# Patient Record
Sex: Male | Born: 1985 | Race: White | Hispanic: No | Smoking: Never smoker
Health system: Southern US, Community
[De-identification: ages and names within clinical notes are randomized; demographics above are authoritative.]

## PROBLEM LIST (undated history)

## (undated) DIAGNOSIS — R12 Heartburn: Secondary | ICD-10-CM

## (undated) DIAGNOSIS — Q211 Atrial septal defect, unspecified: Secondary | ICD-10-CM

## (undated) DIAGNOSIS — F32A Depression, unspecified: Secondary | ICD-10-CM

## (undated) DIAGNOSIS — I519 Heart disease, unspecified: Secondary | ICD-10-CM

## (undated) DIAGNOSIS — I341 Nonrheumatic mitral (valve) prolapse: Secondary | ICD-10-CM

## (undated) DIAGNOSIS — F329 Major depressive disorder, single episode, unspecified: Secondary | ICD-10-CM

## (undated) DIAGNOSIS — F419 Anxiety disorder, unspecified: Secondary | ICD-10-CM

## (undated) HISTORY — PX: CARDIAC SURGERY: SHX584

## (undated) HISTORY — DX: Heartburn: R12

## (undated) HISTORY — DX: Heart disease, unspecified: I51.9

## (undated) HISTORY — PX: PATENT DUCTUS ARTERIOUS REPAIR: SHX269

## (undated) HISTORY — DX: Depression, unspecified: F32.A

## (undated) HISTORY — DX: Major depressive disorder, single episode, unspecified: F32.9

---
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
## 2007-04-29 ENCOUNTER — Emergency Department (HOSPITAL_COMMUNITY): Admission: EM | Admit: 2007-04-29 | Discharge: 2007-04-29 | Payer: Self-pay | Admitting: Emergency Medicine

## 2009-08-05 ENCOUNTER — Emergency Department: Payer: Self-pay | Admitting: Emergency Medicine

## 2010-03-07 ENCOUNTER — Encounter: Admission: RE | Admit: 2010-03-07 | Discharge: 2010-03-07 | Payer: Self-pay | Admitting: Internal Medicine

## 2010-08-04 ENCOUNTER — Emergency Department (HOSPITAL_COMMUNITY)
Admission: EM | Admit: 2010-08-04 | Discharge: 2010-08-04 | Disposition: A | Discharge status: Home / Self Care | Payer: Self-pay | Attending: Emergency Medicine | Admitting: Emergency Medicine

## 2010-08-04 DIAGNOSIS — T18108A Unspecified foreign body in esophagus causing other injury, initial encounter: Secondary | ICD-10-CM | POA: Insufficient documentation

## 2010-08-04 DIAGNOSIS — R110 Nausea: Secondary | ICD-10-CM | POA: Insufficient documentation

## 2010-08-04 DIAGNOSIS — IMO0002 Reserved for concepts with insufficient information to code with codable children: Secondary | ICD-10-CM | POA: Insufficient documentation

## 2010-10-14 ENCOUNTER — Emergency Department (HOSPITAL_COMMUNITY): Payer: BC Managed Care – PPO

## 2010-10-14 ENCOUNTER — Encounter (HOSPITAL_COMMUNITY): Payer: Self-pay | Admitting: Radiology

## 2010-10-14 ENCOUNTER — Emergency Department (HOSPITAL_COMMUNITY)
Admission: EM | Admit: 2010-10-14 | Discharge: 2010-10-14 | Disposition: A | Discharge status: Home / Self Care | Payer: BC Managed Care – PPO | Attending: Emergency Medicine | Admitting: Emergency Medicine

## 2010-10-14 DIAGNOSIS — S7000XA Contusion of unspecified hip, initial encounter: Secondary | ICD-10-CM | POA: Insufficient documentation

## 2010-10-14 DIAGNOSIS — M545 Low back pain, unspecified: Secondary | ICD-10-CM | POA: Insufficient documentation

## 2010-10-14 DIAGNOSIS — S139XXA Sprain of joints and ligaments of unspecified parts of neck, initial encounter: Secondary | ICD-10-CM | POA: Insufficient documentation

## 2010-10-14 DIAGNOSIS — M542 Cervicalgia: Secondary | ICD-10-CM | POA: Insufficient documentation

## 2010-10-14 LAB — CBC
HCT: 46.2 % (ref 39.0–52.0)
Hemoglobin: 17.1 g/dL — ABNORMAL HIGH (ref 13.0–17.0)
MCH: 32.2 pg (ref 26.0–34.0)
MCV: 87 fL (ref 78.0–100.0)
Platelets: 305 10*3/uL (ref 150–400)
RBC: 5.31 MIL/uL (ref 4.22–5.81)
WBC: 6 10*3/uL (ref 4.0–10.5)

## 2010-10-14 LAB — BASIC METABOLIC PANEL
BUN: 9 mg/dL (ref 6–23)
CO2: 25 mEq/L (ref 19–32)
Chloride: 107 mEq/L (ref 96–112)
GFR calc Af Amer: >60 mL/min (ref >60)
GFR calc non Af Amer: >60 mL/min (ref >60)

## 2010-10-14 LAB — ETHANOL: Alcohol, Ethyl (B): 266 mg/dL — ABNORMAL HIGH (ref 0–10)

## 2010-10-14 LAB — TYPE AND SCREEN: Antibody Screen: NEGATIVE

## 2010-10-14 MED ORDER — IOHEXOL 300 MG/ML  SOLN
80.0000 mL | Freq: Once | INTRAMUSCULAR | Status: AC | PRN
  Administered 2010-10-14: 80 mL via INTRAVENOUS

## 2011-12-08 ENCOUNTER — Emergency Department (HOSPITAL_COMMUNITY): Payer: BC Managed Care – PPO

## 2011-12-08 ENCOUNTER — Emergency Department (HOSPITAL_COMMUNITY)
Admission: EM | Admit: 2011-12-08 | Discharge: 2011-12-08 | Disposition: A | Discharge status: Home / Self Care | Payer: BC Managed Care – PPO | Attending: Emergency Medicine | Admitting: Emergency Medicine

## 2011-12-08 ENCOUNTER — Encounter (HOSPITAL_COMMUNITY): Payer: Self-pay | Admitting: *Deleted

## 2011-12-08 DIAGNOSIS — M25519 Pain in unspecified shoulder: Secondary | ICD-10-CM | POA: Insufficient documentation

## 2011-12-08 DIAGNOSIS — R0602 Shortness of breath: Secondary | ICD-10-CM | POA: Insufficient documentation

## 2011-12-08 DIAGNOSIS — T07XXXA Unspecified multiple injuries, initial encounter: Secondary | ICD-10-CM

## 2011-12-08 DIAGNOSIS — IMO0002 Reserved for concepts with insufficient information to code with codable children: Secondary | ICD-10-CM | POA: Insufficient documentation

## 2011-12-08 DIAGNOSIS — S42009A Fracture of unspecified part of unspecified clavicle, initial encounter for closed fracture: Secondary | ICD-10-CM | POA: Insufficient documentation

## 2011-12-08 DIAGNOSIS — R079 Chest pain, unspecified: Secondary | ICD-10-CM | POA: Insufficient documentation

## 2011-12-08 DIAGNOSIS — M25559 Pain in unspecified hip: Secondary | ICD-10-CM | POA: Insufficient documentation

## 2011-12-08 DIAGNOSIS — M25569 Pain in unspecified knee: Secondary | ICD-10-CM | POA: Insufficient documentation

## 2011-12-08 DIAGNOSIS — S42001A Fracture of unspecified part of right clavicle, initial encounter for closed fracture: Secondary | ICD-10-CM

## 2011-12-08 DIAGNOSIS — S3981XA Other specified injuries of abdomen, initial encounter: Secondary | ICD-10-CM | POA: Insufficient documentation

## 2011-12-08 HISTORY — DX: Anxiety disorder, unspecified: F41.9

## 2011-12-08 LAB — CBC
HCT: 41 % (ref 39.0–52.0)
MCH: 32.8 pg (ref 26.0–34.0)
MCV: 88.9 fL (ref 78.0–100.0)
Platelets: 255 10*3/uL (ref 150–400)
RBC: 4.61 MIL/uL (ref 4.22–5.81)
RDW: 12.3 % (ref 11.5–15.5)
WBC: 7.4 10*3/uL (ref 4.0–10.5)

## 2011-12-08 LAB — POCT I-STAT, CHEM 8
Calcium, Ion: 1.12 mmol/L (ref 1.12–1.32)
HCT: 44 % (ref 39.0–52.0)
Hemoglobin: 15 g/dL (ref 13.0–17.0)
Sodium: 146 mEq/L — ABNORMAL HIGH (ref 135–145)
TCO2: 21 mmol/L (ref 0–100)

## 2011-12-08 LAB — COMPREHENSIVE METABOLIC PANEL
AST: 24 U/L (ref 0–37)
Albumin: 4.4 g/dL (ref 3.5–5.2)
Alkaline Phosphatase: 82 U/L (ref 39–117)
Chloride: 106 mEq/L (ref 96–112)
Potassium: 4 mEq/L (ref 3.5–5.1)
Sodium: 143 mEq/L (ref 135–145)
Total Bilirubin: 0.2 mg/dL — ABNORMAL LOW (ref 0.3–1.2)
Total Protein: 6.9 g/dL (ref 6.0–8.3)

## 2011-12-08 LAB — URINALYSIS, MICROSCOPIC ONLY
Bilirubin Urine: NEGATIVE
Hgb urine dipstick: NEGATIVE
Ketones, ur: NEGATIVE mg/dL
Nitrite: NEGATIVE
Specific Gravity, Urine: 1.04 — ABNORMAL HIGH (ref 1.005–1.030)
pH: 5 (ref 5.0–8.0)

## 2011-12-08 LAB — SAMPLE TO BLOOD BANK

## 2011-12-08 LAB — LACTIC ACID, PLASMA: Lactic Acid, Venous: 2.6 mmol/L — ABNORMAL HIGH (ref 0.5–2.2)

## 2011-12-08 LAB — PROTIME-INR: Prothrombin Time: 13.3 seconds (ref 11.6–15.2)

## 2011-12-08 MED ORDER — MORPHINE SULFATE 4 MG/ML IJ SOLN
4.0000 mg | Freq: Once | INTRAMUSCULAR | Status: AC
  Administered 2011-12-08: 4 mg via INTRAVENOUS

## 2011-12-08 MED ORDER — OXYCODONE-ACETAMINOPHEN 5-325 MG PO TABS: 2.0000 | ORAL_TABLET | ORAL | Status: AC | PRN

## 2011-12-08 MED ORDER — MORPHINE SULFATE 10 MG/ML IJ SOLN
INTRAMUSCULAR | Status: AC
  Administered 2011-12-08: 6 mg via INTRAVENOUS
  Filled 2011-12-08: qty 1

## 2011-12-08 MED ORDER — IOHEXOL 300 MG/ML  SOLN
100.0000 mL | Freq: Once | INTRAMUSCULAR | Status: AC | PRN
  Administered 2011-12-08: 100 mL via INTRAVENOUS

## 2011-12-08 MED ORDER — IBUPROFEN 800 MG PO TABS: 800.0000 mg | ORAL_TABLET | Freq: Three times a day (TID) | ORAL | Status: AC

## 2011-12-08 MED ORDER — ONDANSETRON HCL 4 MG/2ML IJ SOLN
INTRAMUSCULAR | Status: AC
  Administered 2011-12-08: 4 mg via INTRAVENOUS
  Filled 2011-12-08: qty 2
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
## 2011-12-08 NOTE — Progress Notes (Signed)
Orthopedic Tech Progress Note Patient Details:  Steve Stanley 16-Feb-1986 [id number]  Ortho Devices Type of Ortho Device: Sling immobilizer Ortho Device/Splint Interventions: Application   Cammer, Mickie Bail 12/08/2011, 7:38 AM
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
## 2012-04-17 ENCOUNTER — Encounter (INDEPENDENT_AMBULATORY_CARE_PROVIDER_SITE_OTHER): Payer: Self-pay | Admitting: General Surgery

## 2012-04-17 ENCOUNTER — Ambulatory Visit (INDEPENDENT_AMBULATORY_CARE_PROVIDER_SITE_OTHER): Payer: BC Managed Care – PPO | Admitting: General Surgery

## 2012-04-17 VITALS — BP 115/79 | HR 86 | Temp 98.4°F | Resp 14 | Ht 63.0 in | Wt 133.0 lb

## 2012-04-17 DIAGNOSIS — K4090 Unilateral inguinal hernia, without obstruction or gangrene, not specified as recurrent: Secondary | ICD-10-CM

## 2012-04-17 NOTE — Progress Notes (Signed)
Patient ID: Steve Stanley, male   DOB: 1986-02-07, 26 y.o.   MRN: [id number]  Chief Complaint  Patient presents with  . Hernia    HPI Steve A Cocca is a 26 y.o. male Referred by Dr. Brunilda Payor for a right inguinal hernia. Patient has had this for possibly 10-15 years. He states at the right lower hernia has become more painful over the last several months. He also notes that the hernia is become increased in size. There does continue to be reducible at this time but is interfering with his job duties. Patient has no signs or symptoms of constipation or incarceration at this time. HPI  Past Medical History  Diagnosis Date  . Anxiety   . Depression   . Heart disease, unspecified   . Heartburn     Past Surgical History  Procedure Date  . Patent ductus arterious repair     History reviewed. No pertinent family history.  Social History History  Substance Use Topics  . Smoking status: Current Every Day Smoker  . Smokeless tobacco: Not on file  . Alcohol Use: Yes    Allergies  Allergen Reactions  . Benadryl (Diphenhydramine Hcl)     Elevated heart rate    Current Outpatient Prescriptions  Medication Sig Dispense Refill  . clonazePAM (KLONOPIN) 0.5 MG tablet Take 0.5 mg by mouth 3 (three) times daily as needed. anxiety      . FLUoxetine (PROZAC) 10 MG tablet Take 20 mg by mouth daily.         Review of Systems Review of Systems  Blood pressure 115/79, pulse 86, temperature 98.4 F (36.9 C), temperature source Temporal, resp. rate 14, height 5\' 3"  (1.6 m), weight 133 lb (60.328 kg).  Physical Exam Physical Exam   Assessment    26 year old male with a reducible right inguinal hernia and moderate size    Plan    1. We'll proceed operative for a laparoscopic right inguinal hernia peritonitis.  2.All risks and benefits were discussed with the patient, to generally include infection, bleeding, damage to surrounding structures, and recurrence. Alternatives were offered and  described.  All questions were answered and the patient voiced understanding of the procedure and wishes to proceed at this point.        Marigene Ehlers., Saydee Zolman 04/17/2012, 4:15 PM

## 2012-04-22 ENCOUNTER — Telehealth (INDEPENDENT_AMBULATORY_CARE_PROVIDER_SITE_OTHER): Payer: Self-pay | Admitting: General Surgery

## 2012-04-22 NOTE — Telephone Encounter (Signed)
Pt called to ask if Dr. Derrell Lolling would write him out of work.  He works in a [employer] regularly; there is no light duty available.  He is experiencing a problem with bowel and bladder control with the straining to lift.  Suggested "adult diaper/ underwear" be used to prevent soiling of his clothes, but he does not want to use these.  Please advise.

## 2012-04-22 NOTE — Telephone Encounter (Signed)
He should be able to have bowel and bladder function and his hernia surgery shouldn't have changed that.  He can come see me in clinic next week or see Urge doc at anytime if he thinks its urgent.

## 2012-04-22 NOTE — Telephone Encounter (Signed)
Called pt and updated him with Dr. Jacinto Halim response to his request.  He will contact his PCP for the problem.

## 2012-05-01 DIAGNOSIS — K4090 Unilateral inguinal hernia, without obstruction or gangrene, not specified as recurrent: Secondary | ICD-10-CM

## 2012-05-01 HISTORY — PX: HERNIA REPAIR: SHX51

## 2012-05-06 ENCOUNTER — Other Ambulatory Visit (INDEPENDENT_AMBULATORY_CARE_PROVIDER_SITE_OTHER): Payer: Self-pay | Admitting: General Surgery

## 2012-05-06 ENCOUNTER — Telehealth (INDEPENDENT_AMBULATORY_CARE_PROVIDER_SITE_OTHER): Payer: Self-pay | Admitting: General Surgery

## 2012-05-06 DIAGNOSIS — Z8719 Personal history of other diseases of the digestive system: Secondary | ICD-10-CM

## 2012-05-06 MED ORDER — HYDROCODONE-ACETAMINOPHEN 5-325 MG PO TABS: 1.0000 | ORAL_TABLET | Freq: Four times a day (QID) | ORAL | Status: DC | PRN

## 2012-05-06 NOTE — Telephone Encounter (Signed)
Pt called in stating her is out of pain medication and that ibuprofen is not working well enough.  This is his first Rx refill since surgery.  I informed him that I can call him in ou standard authorization Rx for hydrocodone-acetaminophen 5-325 mg #30 no refills.  He was okay with this.

## 2012-05-19 ENCOUNTER — Encounter (INDEPENDENT_AMBULATORY_CARE_PROVIDER_SITE_OTHER): Payer: BC Managed Care – PPO | Admitting: General Surgery

## 2012-05-26 ENCOUNTER — Encounter (INDEPENDENT_AMBULATORY_CARE_PROVIDER_SITE_OTHER): Payer: Self-pay

## 2012-05-26 ENCOUNTER — Encounter (INDEPENDENT_AMBULATORY_CARE_PROVIDER_SITE_OTHER): Payer: Self-pay | Admitting: General Surgery

## 2012-05-26 ENCOUNTER — Ambulatory Visit (INDEPENDENT_AMBULATORY_CARE_PROVIDER_SITE_OTHER): Payer: BC Managed Care – PPO | Admitting: General Surgery

## 2012-05-26 VITALS — BP 110/82 | HR 60 | Temp 98.4°F | Resp 12 | Ht 64.0 in | Wt 128.0 lb

## 2012-05-26 DIAGNOSIS — Z9889 Other specified postprocedural states: Secondary | ICD-10-CM
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
## 2012-12-28 ENCOUNTER — Emergency Department (HOSPITAL_COMMUNITY)
Admission: EM | Admit: 2012-12-28 | Discharge: 2012-12-28 | Disposition: A | Discharge status: Home / Self Care | Payer: BC Managed Care – PPO | Source: Home / Self Care

## 2012-12-28 ENCOUNTER — Encounter (HOSPITAL_COMMUNITY): Payer: Self-pay | Admitting: Emergency Medicine

## 2012-12-28 DIAGNOSIS — S46819A Strain of other muscles, fascia and tendons at shoulder and upper arm level, unspecified arm, initial encounter: Secondary | ICD-10-CM

## 2012-12-28 DIAGNOSIS — S335XXA Sprain of ligaments of lumbar spine, initial encounter: Secondary | ICD-10-CM

## 2012-12-28 DIAGNOSIS — S39012A Strain of muscle, fascia and tendon of lower back, initial encounter: Secondary | ICD-10-CM

## 2012-12-28 MED ORDER — KETOROLAC TROMETHAMINE 60 MG/2ML IM SOLN
60.0000 mg | Freq: Once | INTRAMUSCULAR | Status: AC
  Administered 2012-12-28: 60 mg via INTRAMUSCULAR

## 2012-12-28 MED ORDER — KETOROLAC TROMETHAMINE 60 MG/2ML IM SOLN
INTRAMUSCULAR | Status: AC
  Filled 2012-12-28: qty 2

## 2012-12-28 MED ORDER — DICLOFENAC POTASSIUM 50 MG PO TABS: 50.0000 mg | ORAL_TABLET | Freq: Three times a day (TID) | ORAL | Status: DC

## 2012-12-28 MED ORDER — CARISOPRODOL 250 MG PO TABS: 250.0000 mg | ORAL_TABLET | Freq: Four times a day (QID) | ORAL | Status: DC

## 2012-12-28 MED ORDER — TRAMADOL HCL 50 MG PO TABS: 50.0000 mg | ORAL_TABLET | Freq: Four times a day (QID) | ORAL | Status: DC | PRN

## 2012-12-28 MED ORDER — DICLOFENAC SODIUM 1 % TD GEL: 1.0000 "application " | Freq: Four times a day (QID) | TRANSDERMAL | Status: DC

## 2012-12-28 NOTE — ED Provider Notes (Signed)
Medical screening examination/treatment/procedure(s) were performed by non-physician practitioner and as supervising physician I was immediately available for consultation/collaboration.  Sam Overbeck, M.D.  Leeloo Silverthorne C Aedon Deason, MD 12/28/12 1733 

## 2012-12-28 NOTE — ED Provider Notes (Signed)
History    CSN: [id number] Arrival date & time 12/28/12  1110  First MD Initiated Contact with Patient 12/28/12 1245     Chief Complaint  Patient presents with  . Back Pain   (Consider location/radiation/quality/duration/timing/severity/associated sxs/prior Treatment) HPI Comments: 27 year old male presents with pain in his bilateral trapezii that radiates inferiorly along the parathoracic musculature and spreads along the trapezii muscle. Also complaining of pain in the low back/para lumbar musculature. His job entails lifting heavy objects all day long. 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 He denies any particular event that caused pain. The pain is been progressing over the past week increasing as he works. Denies paresthesias however early in the morning he has substantial muscle soreness and weakness in the lower extremities. After approximately 30 minutes he regained his strength in his lower extremities.  Past Medical History  Diagnosis Date  . Anxiety   . Depression   . Heart disease, unspecified   . Heartburn    Past Surgical History  Procedure Laterality Date  . Patent ductus arterious repair    . Hernia repair  05/01/2012    rih repair   No family history on file. History  Substance Use Topics  . Smoking status: Current Every Day Smoker  . Smokeless tobacco: Not on file  . Alcohol Use: Yes    Review of Systems  Constitutional: Negative.   Respiratory: Negative.   Gastrointestinal: Negative.   Genitourinary: Negative.   Musculoskeletal:       As per HPI  Skin: Negative.   Neurological: Negative for dizziness, tremors, weakness, numbness and headaches.    Allergies  Benadryl  Home Medications   Current Outpatient Rx  Name  Route  Sig  Dispense  Refill  . carisoprodol (SOMA) 250 MG tablet   Oral   Take 1 tablet (250 mg total) by mouth 4 (four) times daily.   24 tablet   0   .  clonazePAM (KLONOPIN) 0.5 MG tablet   Oral   Take 0.5 mg by mouth 3 (three) times daily as needed. anxiety         . diclofenac (CATAFLAM) 50 MG tablet   Oral   Take 1 tablet (50 mg total) by mouth 3 (three) times daily. Take with food   21 tablet   0   . diclofenac sodium (VOLTAREN) 1 % GEL   Topical   Apply 1 application topically 4 (four) times daily.   100 g   0   . FLUoxetine (PROZAC) 10 MG tablet   Oral   Take 20 mg by mouth daily.          Marland Kitchen HYDROcodone-acetaminophen (NORCO) 5-325 MG per tablet   Oral   Take 1 tablet by mouth every 6 (six) hours as needed for pain.   30 tablet   0   . traMADol (ULTRAM) 50 MG tablet   Oral   Take 1 tablet (50 mg total) by mouth every 6 (six) hours as needed for pain.   18 tablet   0    BP 137/66  Pulse 80  Temp(Src) 98 F (36.7 C) (Oral)  Resp 18  SpO2 100% Physical Exam  Nursing note and vitals reviewed. Constitutional: He is oriented to person, place, and time. He appears well-developed and well-nourished.  HENT:  Head: Normocephalic and atraumatic.  Eyes: EOM are normal. Left eye exhibits no discharge.  Neck:  Normal range of motion. Neck supple.  Cardiovascular: Normal rate.   Pulmonary/Chest: Effort normal.  Musculoskeletal:  Tenderness in the bilateral trapezii, across the ridges and along the medial aspect trapezii and rhomboid muscles. Is also tenderness in the bilateral para lumbar musculature with the right greater than left. No spinal deformity or tenderness. Peripheral neuro exam is normal.  Neurological: He is alert and oriented to person, place, and time. No cranial nerve deficit. He exhibits normal muscle tone.  Skin: Skin is warm and dry.  Psychiatric: He has a normal mood and affect.    ED Course  Procedures (including critical care time) Labs Reviewed - No data to display No results found. 1. Trapezius strain, unspecified laterality, initial encounter   2. Lumbar strain, initial encounter      MDM  Off work for 3 days. Stretches as demonstrated. Apply diclofenac gel and apply heat pack over it. Cataflam 50 mg 3 times a day when necessary pain Tramadol 50 mg every 6 hours when necessary pain Diclofenac gel 4 times a day No heavy lifting bending or pulling. Muscular back muscles the rest.  Hayden Rasmussen, NP 12/28/12 1336

## 2012-12-28 NOTE — ED Notes (Signed)
C/o neck and back pain, onset about 4 days. Pt states he does heavy lifting at work, he noticed Saturday that his legs were tingling and going numb for 70min-1hour at a time.  Pt has taken Advil with little relief.  Pt states he has had some nausea from the pain.  Pt denies any trauma.  Tami Dora Sims Student

## 2014-11-23 ENCOUNTER — Encounter (HOSPITAL_COMMUNITY): Payer: Self-pay | Admitting: Emergency Medicine

## 2014-11-23 ENCOUNTER — Emergency Department (INDEPENDENT_AMBULATORY_CARE_PROVIDER_SITE_OTHER)
Admission: EM | Admit: 2014-11-23 | Discharge: 2014-11-23 | Disposition: A | Discharge status: Home / Self Care | Payer: Self-pay | Source: Home / Self Care | Attending: Family Medicine | Admitting: Family Medicine

## 2014-11-23 DIAGNOSIS — K602 Anal fissure, unspecified: Secondary | ICD-10-CM

## 2014-11-23 MED ORDER — DILTIAZEM GEL 2 %
CUTANEOUS | Status: DC
Start: 2014-11-23 — End: 2018-07-06

## 2014-11-23 NOTE — ED Notes (Signed)
C/o rectal bleeding which started today Denies any injuries No tx tried

## 2014-11-23 NOTE — Discharge Instructions (Signed)
Anal Fissure, Adult An anal fissure is a small tear or crack in the skin around the anus. Bleeding from a fissure usually stops on its own within a few minutes. However, bleeding will often reoccur with each bowel movement until the crack heals.  CAUSES   Passing large, hard stools.  Frequent diarrheal stools.  Constipation.  Inflammatory bowel disease (Crohn's disease or ulcerative colitis).  Infections.  Anal sex. SYMPTOMS   Small amounts of blood seen on your stools, on toilet paper, or in the toilet after a bowel movement.  Rectal bleeding.  Painful bowel movements.  Itching or irritation around the anus. DIAGNOSIS Your caregiver will examine the anal area. An anal fissure can usually be seen with careful inspection. A rectal exam may be performed and a short tube (anoscope) may be used to examine the anal canal. TREATMENT   You may be instructed to take fiber supplements. These supplements can soften your stool to help make bowel movements easier.  Sitz baths may be recommended to help heal the tear. Do not use soap in the sitz baths.  A medicated cream or ointment may be prescribed to lessen discomfort. HOME CARE INSTRUCTIONS   Maintain a diet high in fruits, whole grains, and vegetables. Avoid constipating foods like bananas and dairy products.  Take sitz baths as directed by your caregiver.  Drink enough fluids to keep your urine clear or pale yellow.  Only take over-the-counter or prescription medicines for pain, discomfort, or fever as directed by your caregiver. Do not take aspirin as this may increase bleeding.  Do not use ointments containing numbing medications (anesthetics) or hydrocortisone. They could slow healing. SEEK MEDICAL CARE IF:   Your fissure is not completely healed within 3 days.  You have further bleeding.  You have a fever.  You have diarrhea mixed with blood.  You have pain.  Your problem is getting worse rather than  better. MAKE SURE YOU:   Understand these instructions.  Will watch your condition.  Will get help right away if you are not doing well or get worse. Document Released: 06/10/2005 Document Revised: 09/02/2011 Document Reviewed: 11/25/2010 [facility] Patient Information 2015 [employer]. This information is not intended to replace advice given to you by your health care provider. Make sure you discuss any questions you have with your health care provider.   Please use Sitz baths (can get at the pharmacy) 2-3 x a week over the next few weeks. Increase your fiber intake as well to soften stools. The gel will need to be used up to 3x daily for 8 weeks, this will allow for healing. [facility]. Should this continue past 8 6-8 weeks or worsens, please f/u with your primary care.

## 2014-11-23 NOTE — ED Provider Notes (Signed)
CSN: [id number]     Arrival date & time 11/23/14  1910 History   First MD Initiated Contact with Patient 11/23/14 1929     Chief Complaint  Patient presents with  . Rectal Bleeding   (Consider location/radiation/quality/duration/timing/severity/associated sxs/prior Treatment) HPI Comments: Patient presents with rectal bleeding that began today after wiping following a "hard" bowel movement. He has a history of constipation. He has mild irritation but no pain. No abdominal pain. No fever or chills. No diarrhea or bloody diarrhea. No history of this in the past and no history of hemorrhoids.   Patient is a 29 y.o. male presenting with hematochezia. The history is provided by the patient.  Rectal Bleeding   Past Medical History  Diagnosis Date  . Anxiety   . Depression   . Heart disease, unspecified   . Heartburn    Past Surgical History  Procedure Laterality Date  . Patent ductus arterious repair    . Hernia repair  05/01/2012    rih repair   History reviewed. No pertinent family history. History  Substance Use Topics  . Smoking status: Current Every Day Smoker  . Smokeless tobacco: Not on file  . Alcohol Use: Yes    Review of Systems  Gastrointestinal: Positive for hematochezia.  All other systems reviewed and are negative.   Allergies  Benadryl  Home Medications   Prior to Admission medications   Medication Sig Start Date End Date Taking? Authorizing Provider  carisoprodol (SOMA) 250 MG tablet Take 1 tablet (250 mg total) by mouth 4 (four) times daily. 12/28/12   Hayden Rasmussen, NP  clonazePAM (KLONOPIN) 0.5 MG tablet Take 0.5 mg by mouth 3 (three) times daily as needed. anxiety    Historical Provider, MD  diclofenac (CATAFLAM) 50 MG tablet Take 1 tablet (50 mg total) by mouth 3 (three) times daily. Take with food 12/28/12   Hayden Rasmussen, NP  diclofenac sodium (VOLTAREN) 1 % GEL Apply 1 application topically 4 (four) times daily. 12/28/12   Hayden Rasmussen, NP  diltiazem 2 % GEL Apply  lightly to area tid x 8 weeks 11/23/14   Riki Sheer, PA-C  FLUoxetine (PROZAC) 10 MG tablet Take 20 mg by mouth daily.     Historical Provider, MD  HYDROcodone-acetaminophen (NORCO) 5-325 MG per tablet Take 1 tablet by mouth every 6 (six) hours as needed for pain. 05/06/12   Axel Filler, MD  traMADol (ULTRAM) 50 MG tablet Take 1 tablet (50 mg total) by mouth every 6 (six) hours as needed for pain. 12/28/12   Hayden Rasmussen, NP   BP 117/80 mmHg  Pulse 78  Temp(Src) 97.9 F (36.6 C) (Oral)  Resp 16  SpO2 98% Physical Exam  Constitutional: He is oriented to person, place, and time. He appears well-developed and well-nourished. No distress.  Genitourinary: Guaiac negative stool.  Small anal fissure (non-necrotic) in the lower portion of the rectal. Tender to palpation. Rectal exam normal. Stool without blood and guaiac normal. No evidence of hemorrhoid.   Neurological: He is alert and oriented to person, place, and time.  Skin: Skin is warm and dry. He is not diaphoretic.  Psychiatric: His behavior is normal.  Nursing note and vitals reviewed.   ED Course  Procedures (including critical care time) Labs Review Labs Reviewed - No data to display  Imaging Review No results found.   MDM   1. Anal fissure    No evidence we are dealing with Crohn's disease. Educated re: fiber intake. Use of sitz  baths for relief. Will start Diltiazem 2% gel (compounded) 3x daily for 8 weeks. F/U if worsening symptoms with PCP.     Riki SheerMichelle G Tunis Gentle, PA-C 11/23/14 2043

## 2015-10-02 ENCOUNTER — Emergency Department (HOSPITAL_COMMUNITY)
Admission: EM | Admit: 2015-10-02 | Discharge: 2015-10-02 | Disposition: A | Discharge status: Home / Self Care | Payer: Self-pay | Attending: Emergency Medicine | Admitting: Emergency Medicine

## 2015-10-02 ENCOUNTER — Ambulatory Visit (HOSPITAL_COMMUNITY)
Admission: EM | Admit: 2015-10-02 | Discharge: 2015-10-02 | Payer: Self-pay | Attending: Emergency Medicine | Admitting: Emergency Medicine

## 2015-10-02 ENCOUNTER — Emergency Department (HOSPITAL_COMMUNITY): Payer: Self-pay

## 2015-10-02 ENCOUNTER — Encounter (HOSPITAL_COMMUNITY): Payer: Self-pay | Admitting: Emergency Medicine

## 2015-10-02 ENCOUNTER — Encounter (HOSPITAL_COMMUNITY): Payer: Self-pay

## 2015-10-02 DIAGNOSIS — Z791 Long term (current) use of non-steroidal anti-inflammatories (NSAID): Secondary | ICD-10-CM | POA: Insufficient documentation

## 2015-10-02 DIAGNOSIS — R079 Chest pain, unspecified: Secondary | ICD-10-CM | POA: Insufficient documentation

## 2015-10-02 DIAGNOSIS — F419 Anxiety disorder, unspecified: Secondary | ICD-10-CM | POA: Insufficient documentation

## 2015-10-02 DIAGNOSIS — R112 Nausea with vomiting, unspecified: Secondary | ICD-10-CM

## 2015-10-02 DIAGNOSIS — Z79899 Other long term (current) drug therapy: Secondary | ICD-10-CM | POA: Insufficient documentation

## 2015-10-02 DIAGNOSIS — F17200 Nicotine dependence, unspecified, uncomplicated: Secondary | ICD-10-CM | POA: Insufficient documentation

## 2015-10-02 DIAGNOSIS — Z7982 Long term (current) use of aspirin: Secondary | ICD-10-CM | POA: Insufficient documentation

## 2015-10-02 DIAGNOSIS — R55 Syncope and collapse: Secondary | ICD-10-CM

## 2015-10-02 DIAGNOSIS — R1111 Vomiting without nausea: Secondary | ICD-10-CM | POA: Insufficient documentation

## 2015-10-02 DIAGNOSIS — R0600 Dyspnea, unspecified: Secondary | ICD-10-CM

## 2015-10-02 DIAGNOSIS — F329 Major depressive disorder, single episode, unspecified: Secondary | ICD-10-CM | POA: Insufficient documentation

## 2015-10-02 HISTORY — DX: Atrial septal defect: Q21.1

## 2015-10-02 HISTORY — DX: Atrial septal defect, unspecified: Q21.10

## 2015-10-02 LAB — I-STAT CHEM 8, ED
BUN: 15 mg/dL (ref 6–20)
CALCIUM ION: 1.16 mmol/L (ref 1.12–1.23)
CHLORIDE: 103 mmol/L (ref 101–111)
Creatinine, Ser: 0.9 mg/dL (ref 0.61–1.24)
Glucose, Bld: 83 mg/dL (ref 65–99)
HCT: 39 % (ref 39.0–52.0)
Hemoglobin: 13.3 g/dL (ref 13.0–17.0)
Potassium: 3.8 mmol/L (ref 3.5–5.1)
SODIUM: 141 mmol/L (ref 135–145)
TCO2: 23 mmol/L (ref 0–100)

## 2015-10-02 LAB — I-STAT TROPONIN, ED: Troponin i, poc: 0 ng/mL (ref 0.00–0.08)

## 2015-10-02 MED ORDER — NITROGLYCERIN 0.4 MG SL SUBL
0.4000 mg | SUBLINGUAL_TABLET | SUBLINGUAL | Status: DC | PRN
  Administered 2015-10-02: 0.4 mg via SUBLINGUAL

## 2015-10-02 MED ORDER — OMEPRAZOLE 20 MG PO CPDR: 20.0000 mg | DELAYED_RELEASE_CAPSULE | Freq: Every day | ORAL | Status: DC

## 2015-10-02 MED ORDER — SODIUM CHLORIDE 0.9 % IV SOLN
Freq: Once | INTRAVENOUS | Status: AC
  Administered 2015-10-02: 16:00:00 via INTRAVENOUS

## 2015-10-02 MED ORDER — NITROGLYCERIN 0.4 MG SL SUBL
SUBLINGUAL_TABLET | SUBLINGUAL | Status: AC
  Filled 2015-10-02: qty 1

## 2015-10-02 MED ORDER — GI COCKTAIL ~~LOC~~
30.0000 mL | Freq: Once | ORAL | Status: AC
  Administered 2015-10-02: 30 mL via ORAL
  Filled 2015-10-02: qty 30

## 2015-10-02 MED ORDER — ASPIRIN 81 MG PO CHEW
CHEWABLE_TABLET | ORAL | Status: AC
  Filled 2015-10-02: qty 4

## 2015-10-02 MED ORDER — ASPIRIN 81 MG PO CHEW
324.0000 mg | CHEWABLE_TABLET | Freq: Once | ORAL | Status: AC
  Administered 2015-10-02: 324 mg via ORAL

## 2015-10-02 NOTE — ED Notes (Addendum)
Here with mid-sternal chest pain with sharp intermit right and left arm pain, Started last night, no medication or medical attention Pain is constant, heaviness affecting breathing Hx Anxiety, taking Xanax 10/10 pain scale

## 2015-10-02 NOTE — ED Notes (Signed)
Pt given Asprin 324mg  tab with relief 8/10 pain X1 Nitro sublig given Pt to be transferred to [place] [facility], stable condition  Will cont to monitor

## 2015-10-02 NOTE — Discharge Instructions (Signed)

## 2015-10-02 NOTE — ED Notes (Signed)
Pt verbalized understanding of discharge instructions and follow-up care. Vital signs stable at discharge. 
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
## 2015-10-02 NOTE — ED Notes (Addendum)
Pt. Coming from urgen care via [facility] c/o substernal chest pain starting last night. Pt. Denies N/V/D at this time. Pt. Given 324 ASA and 1 SL nitro at urgent care. Pt. sts that ASA gave him some pain relief, but no relief with nitro. Pt. describes pain 7/10 non-radiating.   Family reports pain started 10pm last night. Pt. Felt sudden chest pressure, vomited, and then passed out.

## 2015-10-03 ENCOUNTER — Other Ambulatory Visit: Payer: Self-pay | Admitting: Internal Medicine

## 2015-10-03 DIAGNOSIS — R0602 Shortness of breath: Secondary | ICD-10-CM

## 2015-10-03 DIAGNOSIS — R079 Chest pain, unspecified: Secondary | ICD-10-CM

## 2015-10-03 NOTE — ED Provider Notes (Signed)
CSN: [id number]     Arrival date & time 10/02/15  1636 History   First MD Initiated Contact with Patient 10/02/15 1722     Chief Complaint  Patient presents with  . Chest Pain     (Consider location/radiation/quality/duration/timing/severity/associated sxs/prior Treatment) Patient is a 30 y.o. male presenting with chest pain. The history is provided by the patient.  Chest Pain Pain location:  Substernal area Pain quality: pressure   Pain radiates to:  Does not radiate Pain radiates to the back: no   Pain severity:  Moderate Onset quality:  Sudden (immediately after vomiting) Duration:  1 day Timing:  Constant Progression:  Unchanged Chronicity:  New Relieved by:  Nothing Worsened by:  Nothing tried Ineffective treatments:  None tried Associated symptoms: heartburn, syncope (single episode, resolved) and vomiting     Past Medical History  Diagnosis Date  . Anxiety   . Depression   . Heart disease, unspecified   . Heartburn   . Atrial septal defect    Past Surgical History  Procedure Laterality Date  . Patent ductus arterious repair    . Hernia repair  05/01/2012    rih repair   History reviewed. No pertinent family history. Social History  Substance Use Topics  . Smoking status: Current Every Day Smoker  . Smokeless tobacco: None  . Alcohol Use: Yes    Review of Systems  Cardiovascular: Positive for chest pain and syncope (single episode, resolved).  Gastrointestinal: Positive for heartburn and vomiting.  All other systems reviewed and are negative.     Allergies  Benadryl  Home Medications   Prior to Admission medications   Medication Sig Start Date End Date Taking? Authorizing Provider  acetaminophen (TYLENOL) 500 MG tablet Take 500 mg by mouth every 6 (six) hours as needed for moderate pain.    Yes Historical Provider, MD  ALPRAZolam (XANAX) 0.25 MG tablet Take 0.25 mg by mouth 2 (two) times daily as needed for anxiety.   Yes Historical Provider, MD   aspirin EC 81 MG tablet Take 324 mg by mouth once.   Yes Historical Provider, MD  busPIRone (BUSPAR) 5 MG tablet Take 5 mg by mouth 2 (two) times daily.   Yes Historical Provider, MD  nitroGLYCERIN (NITROSTAT) 0.4 MG SL tablet Place 0.4 mg under the tongue once.   Yes Historical Provider, MD  carisoprodol (SOMA) 250 MG tablet Take 1 tablet (250 mg total) by mouth 4 (four) times daily. 12/28/12   Hayden Rasmussenavid Mabe, NP  diclofenac (CATAFLAM) 50 MG tablet Take 1 tablet (50 mg total) by mouth 3 (three) times daily. Take with food 12/28/12   Hayden Rasmussenavid Mabe, NP  diclofenac sodium (VOLTAREN) 1 % GEL Apply 1 application topically 4 (four) times daily. 12/28/12   Hayden Rasmussenavid Mabe, NP  diltiazem 2 % GEL Apply lightly to area tid x 8 weeks 11/23/14   Riki SheerMichelle G Young, PA-C  HYDROcodone-acetaminophen [facility](NORCO) 5-325 MG per tablet Take 1 tablet by mouth every 6 (six) hours as needed for pain. 05/06/12   Axel FillerArmando Ramirez, MD  omeprazole (PRILOSEC) 20 MG capsule Take 1 capsule (20 mg total) by mouth daily. 10/02/15   Lyndal Pulleyaniel Briyonna Omara, MD  traMADol (ULTRAM) 50 MG tablet Take 1 tablet (50 mg total) by mouth every 6 (six) hours as needed for pain. 12/28/12   Hayden Rasmussenavid Mabe, NP   BP 118/75 mmHg  Pulse 57  Temp(Src) 98.2 F (36.8 C) (Oral)  Resp 13  SpO2 99% Physical Exam  Constitutional: He is oriented to person,  place, and time. He appears well-developed and well-nourished. No distress.  HENT:  Head: Normocephalic and atraumatic.  Eyes: Conjunctivae are normal.  Neck: Neck supple. No tracheal deviation present.  Cardiovascular: Normal rate, regular rhythm and normal heart sounds.  Exam reveals no gallop and no friction rub.   No murmur heard. Pulmonary/Chest: Effort normal and breath sounds normal. No respiratory distress. He has no wheezes. He has no rales. He exhibits no tenderness.  Abdominal: Soft. He exhibits no distension.  Neurological: He is alert and oriented to person, place, and time.  Skin: Skin is warm and dry.  Psychiatric: He  has a normal mood and affect.  Vitals reviewed.   ED Course  Procedures (including critical care time) Labs Review Labs Reviewed  Rosezena Sensor, ED  I-STAT CHEM 8, ED    Imaging Review Dg Chest 2 View  10/02/2015  CLINICAL DATA:  Chest pain and vomiting EXAM: CHEST  2 VIEW COMPARISON:  12/08/2011 chest radiograph. FINDINGS: Stable cardiomediastinal silhouette with normal heart size. No pneumothorax. No pleural effusion. Lungs appear clear, with no acute consolidative airspace disease and no pulmonary edema. IMPRESSION: No active cardiopulmonary disease. Electronically Signed   By: Delbert Phenix M.D.   On: 10/02/2015 18:39   I have personally reviewed and evaluated these images and lab results as part of my medical decision-making.   EKG Interpretation   Date/Time:  Monday October 02 2015 16:45:04 EDT Ventricular Rate:  84 PR Interval:  188 QRS Duration: 94 QT Interval:  359 QTC Calculation: 424 R Axis:   96 Text Interpretation:  Sinus rhythm Borderline right axis deviation  Otherwise normal ECG Confirmed by Saphyre Cillo MD, Reuel Boom ([id number]) on 10/02/2015  4:52:10 PM      MDM   Final diagnoses:  Non-intractable vomiting without nausea, vomiting of unspecified type  Chest pain, unspecified    30 y.o. male presents with chest pain after eating wings last night and becoming ill, vomiting. Had syncopal episode that is low risk by SF rule and may have been related to vagal tone or dehydration. EKG interpreted by me without ST or T wave changes concerning for myocardial ischemia. No delta wave, no prolonged QTc, no brugada to suggest arrhythmogenicity. Heart score is 1. Appears very GI in nature. Provided GI cocktail with improvement in symptoms. Will start on PPI empirically. Plan to follow up with PCP as needed and return precautions discussed for worsening or new concerning symptoms.     Lyndal Pulley, MD 10/03/15 [phone]

## 2015-10-04 ENCOUNTER — Ambulatory Visit
Admission: RE | Admit: 2015-10-04 | Discharge: 2015-10-04 | Disposition: A | Discharge status: Home / Self Care | Payer: No Typology Code available for payment source | Source: Ambulatory Visit | Attending: Internal Medicine | Admitting: Internal Medicine

## 2015-10-04 DIAGNOSIS — R079 Chest pain, unspecified: Secondary | ICD-10-CM

## 2015-10-04 DIAGNOSIS — R0602 Shortness of breath: Secondary | ICD-10-CM

## 2015-10-04 MED ORDER — IOPAMIDOL (ISOVUE-370) INJECTION 76%
80.0000 mL | Freq: Once | INTRAVENOUS | Status: AC | PRN
  Administered 2015-10-04: 80 mL via INTRAVENOUS

## 2015-12-25 ENCOUNTER — Ambulatory Visit (HOSPITAL_COMMUNITY)
Admission: EM | Admit: 2015-12-25 | Discharge: 2015-12-25 | Disposition: A | Discharge status: Home / Self Care | Payer: Self-pay | Attending: Family Medicine | Admitting: Family Medicine

## 2015-12-25 ENCOUNTER — Encounter (HOSPITAL_COMMUNITY): Payer: Self-pay | Admitting: Emergency Medicine

## 2015-12-25 ENCOUNTER — Ambulatory Visit (HOSPITAL_COMMUNITY): Payer: Self-pay

## 2015-12-25 DIAGNOSIS — Z8249 Family history of ischemic heart disease and other diseases of the circulatory system: Secondary | ICD-10-CM | POA: Insufficient documentation

## 2015-12-25 DIAGNOSIS — W2201XA Walked into wall, initial encounter: Secondary | ICD-10-CM | POA: Insufficient documentation

## 2015-12-25 DIAGNOSIS — S6991XA Unspecified injury of right wrist, hand and finger(s), initial encounter: Secondary | ICD-10-CM | POA: Insufficient documentation

## 2015-12-25 DIAGNOSIS — F17200 Nicotine dependence, unspecified, uncomplicated: Secondary | ICD-10-CM | POA: Insufficient documentation

## 2015-12-25 MED ORDER — HYDROCODONE-ACETAMINOPHEN 5-325 MG PO TABS: 2.0000 | ORAL_TABLET | ORAL | Status: DC | PRN

## 2015-12-25 MED ORDER — CEPHALEXIN 500 MG PO CAPS: 500.0000 mg | ORAL_CAPSULE | Freq: Four times a day (QID) | ORAL | Status: DC

## 2015-12-25 NOTE — Discharge Instructions (Signed)
Puncture Wound °A puncture wound is an injury that is caused by a sharp, thin object that goes through your skin, such as a nail. A puncture wound usually does not leave a large opening in your skin, so it may not bleed a lot. However, when you get a puncture wound, dirt or other materials (foreign bodies) can be forced into your wound and break off inside. This makes it more likely that an infection will happen, such as tetanus. °HOME CARE °Medicines  °· Take or apply over-the-counter and prescription medicines only as told by your doctor. °· If you were prescribed an antibiotic medicine, take or apply it as told by your doctor. Do not stop using the antibiotic even if your condition starts to get better. °Wound Care  °· There are many ways to close and cover a wound. For example, a wound can be covered with stitches (sutures), skin glue, or adhesive strips. Follow instructions from your doctor about: °¨ How to take care of your wound. °¨ When and how you should change your bandage (dressing). °¨ When you should remove your bandage. °¨ Removing whatever was used to close your wound. °· Keep the bandage dry as told by your doctor. Do not take baths, swim, use a hot tub, or do anything that would put your wound underwater until your doctor says it is okay. °· Clean the wound as told by your doctor. °· Do not scratch or pick at the wound. °· Check your wound every day for signs of infection. Watch for: °¨ Redness, swelling, or pain. °¨ Fluid, blood, or pus. °General Instructions  °· Raise (elevate) the injured area above the level of your heart while you are sitting or lying down. °· If your puncture wound is in your foot, ask your doctor if you need to avoid putting weight on your foot and for how long. °· Keep all follow-up visits as told by your doctor. This is important. °GET HELP IF: °· You got a tetanus shot and you have any of these problems at the injection site: °¨ Swelling. °¨ Very bad  pain. °¨ Redness. °¨ Bleeding. °· You have a fever. °· Your stitches come out. °· You notice a bad smell coming from your wound or your bandage. °· You notice something coming out of the wound, such as wood or glass. °· Medicine does not help your pain. °· You have more redness, swelling, or pain at the site of your wound. °· You have fluid, blood, or pus coming from your wound. °· You notice a change in the color of your skin near your wound. °· You need to change the bandage often because fluid, blood, or pus is coming from the wound. °· You start to have a new rash. °· You start to have numbness around the wound. °GET HELP RIGHT AWAY IF: °· You have very bad swelling around the wound. °· Your pain suddenly gets worse and is very bad. °· You start to get painful skin lumps. °· You have a red streak going away from your wound. °· The wound is on your hand or foot and you cannot move a finger or toe like you usually can. °· The wound is on your hand or foot and you notice that your fingers or toes look pale or bluish. °  °This information is not intended to replace advice given to you by your health care provider. Make sure you discuss any questions you have with your health care provider. °  °Document   Released: 03/19/2008 Document Revised: 03/01/2015 Document Reviewed: 08/03/2014 °Elsevier Interactive Patient Education ©2016 [employer] ° °

## 2015-12-25 NOTE — ED Notes (Signed)
[facility] with a complaint of an injury to his right hand secondary to hitting a glass window.

## 2015-12-25 NOTE — ED Provider Notes (Signed)
CSN: [id number]     Arrival date & time 12/25/15  1936 History   First MD Initiated Contact with Patient 12/25/15 2011     No chief complaint on file.  (Consider location/radiation/quality/duration/timing/severity/associated sxs/prior Treatment) HPI History obtained from patient: Location:  Right hand Context/Duration: Friday night punched a wall  Severity: 6  Quality: Aching Timing:           Constant  Home Treatment: Cleaned wound and used a Band-Aid Associated symptoms:  Pain to make a fist Family History: Hypertension.   Past Medical History  Diagnosis Date  . Anxiety   . Depression   . Heart disease, unspecified   . Heartburn   . Atrial septal defect    Past Surgical History  Procedure Laterality Date  . Patent ductus arterious repair    . Hernia repair  05/01/2012    rih repair   No family history on file. Social History  Substance Use Topics  . Smoking status: Current Every Day Smoker  . Smokeless tobacco: Not on file  . Alcohol Use: Yes    Review of Systems  Denies: HEADACHE, NAUSEA, ABDOMINAL PAIN, CHEST PAIN, CONGESTION, DYSURIA, SHORTNESS OF BREATH  Allergies  Benadryl  Home Medications   Prior to Admission medications   Medication Sig Start Date End Date Taking? Authorizing Provider  acetaminophen (TYLENOL) 500 MG tablet Take 500 mg by mouth every 6 (six) hours as needed for moderate pain.     Historical Provider, MD  ALPRAZolam Prudy Feeler(XANAX) 0.25 MG tablet Take 0.25 mg by mouth 2 (two) times daily as needed for anxiety.    Historical Provider, MD  aspirin EC 81 MG tablet Take 324 mg by mouth once.    Historical Provider, MD  busPIRone (BUSPAR) 5 MG tablet Take 5 mg by mouth 2 (two) times daily.    Historical Provider, MD  carisoprodol (SOMA) 250 MG tablet Take 1 tablet (250 mg total) by mouth 4 (four) times daily. 12/28/12   Hayden Steve Mabe, NP  diclofenac (CATAFLAM) 50 MG tablet Take 1 tablet (50 mg total) by mouth 3 (three) times daily. Take with food 12/28/12    Hayden Steve Mabe, NP  diclofenac sodium (VOLTAREN) 1 % GEL Apply 1 application topically 4 (four) times daily. 12/28/12   Hayden Steve Mabe, NP  diltiazem 2 % GEL Apply lightly to area tid x 8 weeks 11/23/14   Riki SheerMichelle G Young, PA-C  HYDROcodone-acetaminophen [facility](NORCO) 5-325 MG per tablet Take 1 tablet by mouth every 6 (six) hours as needed for pain. 05/06/12   Axel FillerArmando Ramirez, MD  nitroGLYCERIN (NITROSTAT) 0.4 MG SL tablet Place 0.4 mg under the tongue once.    Historical Provider, MD  omeprazole (PRILOSEC) 20 MG capsule Take 1 capsule (20 mg total) by mouth daily. 10/02/15   Steve Pulleyaniel Knott, MD  traMADol (ULTRAM) 50 MG tablet Take 1 tablet (50 mg total) by mouth every 6 (six) hours as needed for pain. 12/28/12   Hayden Steve Mabe, NP   Meds Ordered and Administered this Visit  Medications - No data to display  There were no vitals taken for this visit. No data found.   Physical Exam NURSES NOTES AND VITAL SIGNS REVIEWED. CONSTITUTIONAL: Well developed, well nourished, no acute distress HEENT: normocephalic, atraumatic EYES: Conjunctiva normal NECK:normal ROM, supple, no adenopathy PULMONARY:No respiratory distress, normal effort ABDOMINAL: Soft, ND, NT BS+, No CVAT MUSCULOSKELETAL: Normal ROM of all extremities, right hand fifth MCP tender there is a small rupture wound. Tender to palpation. Opposition is intact. SKIN: warm and  dry without rash PSYCHIATRIC: Mood and affect, behavior are normal  ED Course  Procedures (including critical care time)  Labs Review Labs Reviewed - No data to display  Imaging Review No results found.   Visual Acuity Review  Right Eye Distance:   Left Eye Distance:   Bilateral Distance:    Right Eye Near:   Left Eye Near:    Bilateral Near:        Prescription for Keflex, hydrocodone MDM   1. Hand injury, right, initial encounter     Patient is reassured that there are no issues that require transfer to higher level of care at this time or additional tests. Patient  is advised to continue home symptomatic treatment. Patient is advised that if there are new or worsening symptoms to attend the emergency department, contact primary care provider, or return to UC. Instructions of care provided discharged home in stable condition.    THIS NOTE WAS GENERATED USING A VOICE RECOGNITION SOFTWARE PROGRAM. ALL REASONABLE EFFORTS  WERE MADE TO PROOFREAD THIS DOCUMENT FOR ACCURACY.  I have verbally reviewed the discharge instructions with the patient. A printed AVS was given to the patient.  All questions were answered prior to discharge.      Tharon AquasFrank C Patrick, PA 12/25/15 2128

## 2016-06-28 ENCOUNTER — Ambulatory Visit (HOSPITAL_COMMUNITY)
Admission: EM | Admit: 2016-06-28 | Discharge: 2016-06-28 | Disposition: A | Discharge status: Home / Self Care | Payer: Self-pay | Attending: Family Medicine | Admitting: Family Medicine

## 2016-06-28 ENCOUNTER — Encounter (HOSPITAL_COMMUNITY): Payer: Self-pay | Admitting: Family Medicine

## 2016-06-28 DIAGNOSIS — J40 Bronchitis, not specified as acute or chronic: Secondary | ICD-10-CM

## 2016-06-28 DIAGNOSIS — J0110 Acute frontal sinusitis, unspecified: Secondary | ICD-10-CM

## 2016-06-28 MED ORDER — ALBUTEROL SULFATE HFA 108 (90 BASE) MCG/ACT IN AERS: 2.0000 | INHALATION_SPRAY | Freq: Four times a day (QID) | RESPIRATORY_TRACT | 0 refills | Status: DC | PRN

## 2016-06-28 MED ORDER — AMOXICILLIN-POT CLAVULANATE 875-125 MG PO TABS: 1.0000 | ORAL_TABLET | Freq: Two times a day (BID) | ORAL | 0 refills | Status: AC

## 2016-06-28 MED ORDER — BENZONATATE 100 MG PO CAPS
100.0000 mg | ORAL_CAPSULE | Freq: Three times a day (TID) | ORAL | 0 refills | Status: DC | PRN
Start: 2016-06-28 — End: 2018-07-06

## 2016-06-28 NOTE — Discharge Instructions (Signed)
Start Augmentin twice a day as directed. May take Benzonatate cough pills 1 every 8 hours as needed. May use Albuterol inhaler 2 puffs every 6 hours as needed for cough. Follow-up with your primary care provider within 5 days if not improving or go to ER if symptoms worsen.

## 2016-06-28 NOTE — ED Triage Notes (Signed)
Pt here for cough, fever and wheezing. sts his fiance  had PNA.

## 2016-06-28 NOTE — ED Provider Notes (Signed)
CSN: [id number]     Arrival date & time 06/28/16  1104 History   First MD Initiated Contact with Patient 06/28/16 1306     Chief Complaint  Patient presents with  . Cough  . Fever   (Consider location/radiation/quality/duration/timing/severity/associated sxs/prior Treatment) 31 year old male presents with nasal congestion, sinus pressure, cough and chest congestion for the past 5 to 6 days. Also has had a fever as high as 102.9 but no fever today. Denies any GI symptoms. Having difficulty sleeping due to cough. Wife also sick with pneumonia. Has taken OTC cold and cough medication with minimal relief. Does not have health insurance and requests generic medication if needed.    The history is provided by the patient.    Past Medical History:  Diagnosis Date  . Anxiety   . Atrial septal defect   . Depression   . Heart disease, unspecified   . Heartburn    Past Surgical History:  Procedure Laterality Date  . HERNIA REPAIR  05/01/2012   rih repair  . PATENT DUCTUS ARTERIOUS REPAIR     History reviewed. No pertinent family history. Social History  Substance Use Topics  . Smoking status: Current Every Day Smoker  . Smokeless tobacco: Never Used  . Alcohol use Yes    Review of Systems  Constitutional: Positive for fatigue and fever. Negative for appetite change and chills.  HENT: Positive for congestion, rhinorrhea and sinus pressure. Negative for ear discharge, ear pain and sore throat.   Eyes: Negative for discharge.  Respiratory: Positive for cough and wheezing. Negative for chest tightness and shortness of breath.   Cardiovascular: Negative for chest pain.  Gastrointestinal: Negative for abdominal pain, diarrhea, nausea and vomiting.  Musculoskeletal: Negative for arthralgias, back pain, myalgias, neck pain and neck stiffness.  Skin: Negative for rash.  Neurological: Positive for headaches. Negative for dizziness, syncope, weakness and light-headedness.  Hematological:  Negative for adenopathy.    Allergies  Benadryl [diphenhydramine hcl]  Home Medications   Prior to Admission medications   Medication Sig Start Date End Date Taking? Authorizing Provider  acetaminophen (TYLENOL) 500 MG tablet Take 500 mg by mouth every 6 (six) hours as needed for moderate pain.     Historical Provider, MD  albuterol (PROVENTIL HFA;VENTOLIN HFA) 108 (90 Base) MCG/ACT inhaler Inhale 2 puffs into the lungs every 6 (six) hours as needed for wheezing or shortness of breath. 06/28/16   Sudie Grumbling, NP  ALPRAZolam Prudy Feeler) 0.25 MG tablet Take 0.25 mg by mouth 2 (two) times daily as needed for anxiety.    Historical Provider, MD  amoxicillin-clavulanate (AUGMENTIN) 875-125 MG tablet Take 1 tablet by mouth every 12 (twelve) hours. 06/28/16 07/05/16  Sudie Grumbling, NP  aspirin EC 81 MG tablet Take 324 mg by mouth once.    Historical Provider, MD  benzonatate (TESSALON) 100 MG capsule Take 1 capsule (100 mg total) by mouth 3 (three) times daily as needed for cough. 06/28/16   Sudie Grumbling, NP  busPIRone (BUSPAR) 5 MG tablet Take 5 mg by mouth 2 (two) times daily.    Historical Provider, MD  diltiazem 2 % GEL Apply lightly to area tid x 8 weeks 11/23/14   Riki Sheer, PA-C  nitroGLYCERIN (NITROSTAT) 0.4 MG SL tablet Place 0.4 mg under the tongue once.    Historical Provider, MD  omeprazole (PRILOSEC) 20 MG capsule Take 1 capsule (20 mg total) by mouth daily. 10/02/15   Lyndal Pulley, MD   Meds Ordered  and Administered this Visit  Medications - No data to display  BP 139/93   Pulse 87   Temp 98.1 F (36.7 C)   Resp 18   SpO2 100%  No data found.   Physical Exam  Constitutional: He is oriented to person, place, and time. He appears well-developed and well-nourished. No distress.  HENT:  Head: Normocephalic and atraumatic.  Right Ear: Hearing, tympanic membrane, external ear and ear canal normal.  Left Ear: Hearing, tympanic membrane, external ear and ear canal normal.   Nose: Mucosal edema and rhinorrhea present. Right sinus exhibits frontal sinus tenderness. Right sinus exhibits no maxillary sinus tenderness. Left sinus exhibits frontal sinus tenderness. Left sinus exhibits no maxillary sinus tenderness.  Mouth/Throat: Uvula is midline and mucous membranes are normal. Posterior oropharyngeal erythema present.  Neck: Normal range of motion. Neck supple.  Cardiovascular: Normal rate, regular rhythm and normal heart sounds.   Pulmonary/Chest: Effort normal. No respiratory distress. He has decreased breath sounds in the right upper field, the right lower field, the left upper field and the left lower field. He has wheezes in the right upper field and the left upper field. He has rhonchi in the right upper field and the left upper field.  Lymphadenopathy:    He has no cervical adenopathy.  Neurological: He is alert and oriented to person, place, and time.  Skin: Skin is warm and dry. Capillary refill takes less than 2 seconds.  Psychiatric: He has a normal mood and affect. His behavior is normal. Judgment and thought content normal.    Urgent Care Course   Clinical Course     Procedures (including critical care time)  Labs Review Labs Reviewed - No data to display  Imaging Review No results found.   Visual Acuity Review  Right Eye Distance:   Left Eye Distance:   Bilateral Distance:    Right Eye Near:   Left Eye Near:    Bilateral Near:         MDM   1. Acute non-recurrent frontal sinusitis   2. Bronchitis    Discussed performing chest x-ray today but patient declines due to cost. Recommend Augmentin 875mg  twice a day as directed. May take Benzonatate cough pills 1 every 8 hours as needed. May use Albuterol inhaler 2 puffs every 6 hours as needed for cough. Increase fluid intake to help loosen up mucus. Note written for work. Follow-up with his primary care provider within 5 days if not improving or go to ER if symptoms worsen.     Sudie GrumblingAnn  Berry Ebunoluwa Gernert, NP 06/29/16 [id number]

## 2016-08-19 ENCOUNTER — Ambulatory Visit (HOSPITAL_COMMUNITY)
Admission: EM | Admit: 2016-08-19 | Discharge: 2016-08-19 | Disposition: A | Discharge status: Home / Self Care | Payer: Self-pay | Attending: Family Medicine | Admitting: Family Medicine

## 2016-08-19 ENCOUNTER — Encounter (HOSPITAL_COMMUNITY): Payer: Self-pay | Admitting: Emergency Medicine

## 2016-08-19 DIAGNOSIS — J9801 Acute bronchospasm: Secondary | ICD-10-CM

## 2016-08-19 MED ORDER — IPRATROPIUM-ALBUTEROL 0.5-2.5 (3) MG/3ML IN SOLN
3.0000 mL | Freq: Once | RESPIRATORY_TRACT | Status: AC
  Administered 2016-08-19: 3 mL via RESPIRATORY_TRACT

## 2016-08-19 MED ORDER — PREDNISONE 50 MG PO TABS: ORAL_TABLET | ORAL | 0 refills | Status: DC

## 2016-08-19 MED ORDER — ALBUTEROL SULFATE HFA 108 (90 BASE) MCG/ACT IN AERS: 2.0000 | INHALATION_SPRAY | RESPIRATORY_TRACT | 0 refills | Status: DC | PRN

## 2016-08-19 MED ORDER — DEXAMETHASONE SODIUM PHOSPHATE 10 MG/ML IJ SOLN
10.0000 mg | Freq: Once | INTRAMUSCULAR | Status: AC
  Administered 2016-08-19: 10 mg via INTRAMUSCULAR

## 2016-08-19 MED ORDER — IPRATROPIUM-ALBUTEROL 0.5-2.5 (3) MG/3ML IN SOLN
RESPIRATORY_TRACT | Status: AC
  Filled 2016-08-19: qty 3

## 2016-08-19 MED ORDER — DEXAMETHASONE SODIUM PHOSPHATE 10 MG/ML IJ SOLN
INTRAMUSCULAR | Status: AC
  Filled 2016-08-19: qty 1

## 2016-08-19 MED ORDER — ALBUTEROL SULFATE (2.5 MG/3ML) 0.083% IN NEBU
INHALATION_SOLUTION | RESPIRATORY_TRACT | Status: AC
  Filled 2016-08-19: qty 3

## 2016-08-19 MED ORDER — ALBUTEROL SULFATE (2.5 MG/3ML) 0.083% IN NEBU
2.5000 mg | INHALATION_SOLUTION | Freq: Once | RESPIRATORY_TRACT | Status: AC
  Administered 2016-08-19: 2.5 mg via RESPIRATORY_TRACT

## 2016-08-19 NOTE — ED Provider Notes (Signed)
CSN: [id number]     Arrival date & time 08/19/16  1742 History   First MD Initiated Contact with Patient 08/19/16 1922     Chief Complaint  Patient presents with  . flu like symptoms   (Consider location/radiation/quality/duration/timing/severity/associated sxs/prior Treatment) 31 year old male with a history of smoking presents to the urgent care with a recent history of increased cough more so while trying to sleep and complains of mouth being dry.      Past Medical History:  Diagnosis Date  . Anxiety   . Atrial septal defect   . Depression   . Heart disease, unspecified   . Heartburn    Past Surgical History:  Procedure Laterality Date  . HERNIA REPAIR  05/01/2012   rih repair  . PATENT DUCTUS ARTERIOUS REPAIR     History reviewed. No pertinent family history. Social History  Substance Use Topics  . Smoking status: Current Every Day Smoker  . Smokeless tobacco: Never Used  . Alcohol use Yes    Review of Systems  Constitutional: Positive for activity change and fever.  HENT: Positive for postnasal drip.   Eyes: Negative.   Respiratory: Positive for cough, shortness of breath and wheezing.   Cardiovascular: Negative for chest pain.  Musculoskeletal: Negative.   Neurological: Negative.   All other systems reviewed and are negative.   Allergies  Benadryl [diphenhydramine hcl]  Home Medications   Prior to Admission medications   Medication Sig Start Date End Date Taking? Authorizing Provider  acetaminophen (TYLENOL) 500 MG tablet Take 500 mg by mouth every 6 (six) hours as needed for moderate pain.    Yes Historical Provider, MD  omeprazole (PRILOSEC) 20 MG capsule Take 1 capsule (20 mg total) by mouth daily. 10/02/15  Yes Lyndal Pulley, MD  albuterol (PROVENTIL HFA;VENTOLIN HFA) 108 (90 Base) MCG/ACT inhaler Inhale 2 puffs into the lungs every 4 (four) hours as needed for wheezing or shortness of breath. 08/19/16   Hayden Rasmussen, NP  ALPRAZolam Prudy Feeler) 0.25 MG tablet  Take 0.25 mg by mouth 2 (two) times daily as needed for anxiety.    Historical Provider, MD  aspirin EC 81 MG tablet Take 324 mg by mouth once.    Historical Provider, MD  benzonatate (TESSALON) 100 MG capsule Take 1 capsule (100 mg total) by mouth 3 (three) times daily as needed for cough. 06/28/16   Sudie Grumbling, NP  busPIRone (BUSPAR) 5 MG tablet Take 5 mg by mouth 2 (two) times daily.    Historical Provider, MD  diltiazem 2 % GEL Apply lightly to area tid x 8 weeks 11/23/14   Riki Sheer, PA-C  nitroGLYCERIN (NITROSTAT) 0.4 MG SL tablet Place 0.4 mg under the tongue once.    Historical Provider, MD  predniSONE (DELTASONE) 50 MG tablet 1 tab po daily for 6 days. Take with food. 08/19/16   Hayden Rasmussen, NP   Meds Ordered and Administered this Visit   Medications  ipratropium-albuterol (DUONEB) 0.5-2.5 (3) MG/3ML nebulizer solution 3 mL (3 mLs Nebulization Given 08/19/16 1940)  albuterol (PROVENTIL) (2.5 MG/3ML) 0.083% nebulizer solution 2.5 mg (2.5 mg Nebulization Given 08/19/16 1940)  dexamethasone (DECADRON) injection 10 mg (10 mg Intramuscular Given 08/19/16 1940)    BP 115/66 (BP Location: Right Arm)   Pulse 68   Temp 98.4 F (36.9 C) (Oral)   SpO2 100%  No data found.   Physical Exam  Constitutional: He is oriented to person, place, and time. He appears well-developed and well-nourished. No distress.  HENT:  Head: Normocephalic and atraumatic.  Mouth/Throat: No oropharyngeal exudate.  Oropharynx with minor erythema and thick PND.  Eyes: EOM are normal.  Neck: Normal range of motion. Neck supple.  Cardiovascular: Normal rate, regular rhythm, normal heart sounds and intact distal pulses.   Pulmonary/Chest: Effort normal. He has wheezes. He has no rales.  Bilateral Inspiratory and expiratory wheezing with prolonged inspiratory and expiratory phases. Diminished air movement.  Lymphadenopathy:    He has no cervical adenopathy.  Neurological: He is alert and oriented to person,  place, and time.  Skin: Skin is warm and dry.  Psychiatric: He has a normal mood and affect.  Nursing note and vitals reviewed.   Urgent Care Course     Procedures (including critical care time)  Labs Review Labs Reviewed - No data to display  Imaging Review No results found.   Visual Acuity Review  Right Eye Distance:   Left Eye Distance:   Bilateral Distance:    Right Eye Near:   Left Eye Near:    Bilateral Near:         MDM   1. Bronchospasm   Much improved after the DuoNeb. No more wheezing. Still has some prolonged expiratory phase and bronchospasm still exist but much improved. He states the feels well enough to go home and does not need another breathing treatment. Use the albuterol inhaler 2 puffs every 4 hours as needed. Start taking the prednisone tomorrow. Take with food. No smoking. Recommend taking an antihistamine to help with drainage such as Allegra or Zyrtec. Follow-up with your doctor as needed. For problems breathing and wheezing may return or go to the emergency department if needed. Meds ordered this encounter  Medications  . ipratropium-albuterol (DUONEB) 0.5-2.5 (3) MG/3ML nebulizer solution 3 mL  . albuterol (PROVENTIL) (2.5 MG/3ML) 0.083% nebulizer solution 2.5 mg  . dexamethasone (DECADRON) injection 10 mg  . albuterol (PROVENTIL HFA;VENTOLIN HFA) 108 (90 Base) MCG/ACT inhaler    Sig: Inhale 2 puffs into the lungs every 4 (four) hours as needed for wheezing or shortness of breath.    Dispense:  1 Inhaler    Refill:  0    Order Specific Question:   Supervising Provider    Answer:   Bradd CanaryKINDL, JAMES D [id number]  . predniSONE (DELTASONE) 50 MG tablet    Sig: 1 tab po daily for 6 days. Take with food.    Dispense:  6 tablet    Refill:  0    Order Specific Question:   Supervising Provider    Answer:   Linna HoffKINDL, JAMES D [[id number]       Hayden Rasmussenavid Romain Erion, NP 08/19/16 2032    Hayden Rasmussenavid Shanessa Hodak, NP 08/19/16 2033

## 2016-08-19 NOTE — ED Triage Notes (Signed)
Pt reports fever, body aches and cough since Friday.  He reports a fever of 104 on Friday.

## 2016-08-19 NOTE — Discharge Instructions (Signed)
Use the albuterol inhaler 2 puffs every 4 hours as needed. Start taking the prednisone tomorrow. Take with food. No smoking. Recommend taking an antihistamine to help with drainage such as Allegra or Zyrtec. Follow-up with your doctor as needed. For problems breathing and wheezing may return or go to the emergency department if needed.

## 2017-07-29 ENCOUNTER — Emergency Department (HOSPITAL_BASED_OUTPATIENT_CLINIC_OR_DEPARTMENT_OTHER)
Admission: EM | Admit: 2017-07-29 | Discharge: 2017-07-29 | Disposition: A | Discharge status: Home / Self Care | Payer: Self-pay | Attending: Emergency Medicine | Admitting: Emergency Medicine

## 2017-07-29 ENCOUNTER — Other Ambulatory Visit: Payer: Self-pay

## 2017-07-29 ENCOUNTER — Emergency Department (HOSPITAL_BASED_OUTPATIENT_CLINIC_OR_DEPARTMENT_OTHER): Payer: Self-pay

## 2017-07-29 ENCOUNTER — Encounter (HOSPITAL_BASED_OUTPATIENT_CLINIC_OR_DEPARTMENT_OTHER): Payer: Self-pay | Admitting: *Deleted

## 2017-07-29 DIAGNOSIS — R002 Palpitations: Secondary | ICD-10-CM | POA: Insufficient documentation

## 2017-07-29 DIAGNOSIS — F17210 Nicotine dependence, cigarettes, uncomplicated: Secondary | ICD-10-CM | POA: Insufficient documentation

## 2017-07-29 DIAGNOSIS — Z7982 Long term (current) use of aspirin: Secondary | ICD-10-CM | POA: Insufficient documentation

## 2017-07-29 DIAGNOSIS — Z79899 Other long term (current) drug therapy: Secondary | ICD-10-CM | POA: Insufficient documentation

## 2017-07-29 LAB — TROPONIN I: Troponin I: <0.03 ng/mL (ref <0.03)

## 2017-07-29 LAB — BASIC METABOLIC PANEL
Anion gap: 9 (ref 5–15)
BUN: 14 mg/dL (ref 6–20)
CALCIUM: 9.2 mg/dL (ref 8.9–10.3)
CO2: 26 mmol/L (ref 22–32)
CREATININE: 0.89 mg/dL (ref 0.61–1.24)
Chloride: 104 mmol/L (ref 101–111)
GFR calc non Af Amer: >60 mL/min (ref >60)
Glucose, Bld: 102 mg/dL — ABNORMAL HIGH (ref 65–99)
Potassium: 4.5 mmol/L (ref 3.5–5.1)
SODIUM: 139 mmol/L (ref 135–145)

## 2017-07-29 LAB — CBC
HCT: 45 % (ref 39.0–52.0)
Hemoglobin: 16 g/dL (ref 13.0–17.0)
MCH: 31.9 pg (ref 26.0–34.0)
MCHC: 35.6 g/dL (ref 30.0–36.0)
MCV: 89.8 fL (ref 78.0–100.0)
PLATELETS: 278 10*3/uL (ref 150–400)
RBC: 5.01 MIL/uL (ref 4.22–5.81)
RDW: 12.4 % (ref 11.5–15.5)
WBC: 7.1 10*3/uL (ref 4.0–10.5)

## 2017-07-29 LAB — TSH: TSH: 1.537 u[IU]/mL (ref 0.350–4.500)

## 2017-07-29 MED ORDER — METOPROLOL TARTRATE 25 MG PO TABS: 12.5000 mg | ORAL_TABLET | Freq: Two times a day (BID) | ORAL | 0 refills | Status: DC

## 2017-07-29 NOTE — ED Notes (Signed)
Patient stated that, "it's not so much pain, its the jolt." "It woke me up all night."

## 2017-07-29 NOTE — ED Provider Notes (Signed)
[facility] EMERGENCY DEPARTMENT Provider Note   CSN: [id number] Arrival date & time: 07/29/17  1118     History   Chief Complaint Chief Complaint  Patient presents with  . Palpitations    HPI [place] Steve Stanley is Steve 32 y.o. male.  HPI Patient is Steve 32 year old male who presents emergency department with intermittent episodes of acute palpitations.  He had palpitations last night.  He states it felt like his heart was racing.  No prior diagnosis of atrial fibrillation or atrial flutter or SVT.  He has never been placed on Steve Holter monitor.  He has not recently had thyroid levels checked.  He denies excessive caffeine intake.  He denies use of cough and cold preparations.  Asymptomatic at this time.  He states that in the past he has had episodes of syncope associated with his palpitations.  None last night or recently.   Past Medical History:  Diagnosis Date  . Anxiety   . Atrial septal defect   . Depression   . Heart disease, unspecified   . Heartburn     There are no active problems to display for this patient.   Past Surgical History:  Procedure Laterality Date  . HERNIA REPAIR  05/01/2012   rih repair  . PATENT DUCTUS ARTERIOUS REPAIR         Home Medications    Prior to Admission medications   Medication Sig Start Date End Date Taking? Authorizing Provider  acetaminophen (TYLENOL) 500 MG tablet Take 500 mg by mouth every 6 (six) hours as needed for moderate pain.     [provider]  albuterol (PROVENTIL HFA;VENTOLIN HFA) 108 (90 Base) MCG/ACT inhaler Inhale 2 puffs into the lungs every 4 (four) hours as needed for wheezing or shortness of breath. 08/19/16   Hayden Rasmussen, NP  ALPRAZolam Prudy Feeler) 0.25 MG tablet Take 0.25 mg by mouth 2 (two) times daily as needed for anxiety.    [provider]  aspirin EC 81 MG tablet Take 324 mg by mouth once.    [provider]  benzonatate (TESSALON) 100 MG capsule Take 1 capsule (100 mg total) by  mouth 3 (three) times daily as needed for cough. 06/28/16   Sudie Grumbling, NP  busPIRone (BUSPAR) 5 MG tablet Take 5 mg by mouth 2 (two) times daily.    [provider]  diltiazem 2 % GEL Apply lightly to area tid x 8 weeks 11/23/14   Riki Sheer, PA-C  metoprolol tartrate (LOPRESSOR) 25 MG tablet Take 0.5 tablets (12.5 mg total) by mouth 2 (two) times daily. 07/29/17   Azalia Bilis, MD  nitroGLYCERIN (NITROSTAT) 0.4 MG SL tablet Place 0.4 mg under the tongue once.    [provider]  omeprazole (PRILOSEC) 20 MG capsule Take 1 capsule (20 mg total) by mouth daily. 10/02/15   Lyndal Pulley, MD  predniSONE (DELTASONE) 50 MG tablet 1 tab po daily for 6 days. Take with food. 08/19/16   Hayden Rasmussen, NP    Family History No family history on file.  Social History Social History   Tobacco Use  . Smoking status: Current Every Day Smoker  . Smokeless tobacco: Never Used  Substance Use Topics  . Alcohol use: Yes  . Drug use: No     Allergies   Benadryl [diphenhydramine hcl]   Review of Systems Review of Systems  All other systems reviewed and are negative.    Physical Exam Updated Vital Signs BP (!) 141/101  Pulse 75   Temp 97.9 F (36.6 C) (Oral)   Resp 16   Ht 5\' 1"  (1.549 m)   Wt 58.1 kg (128 lb)   SpO2 100%   BMI 24.19 kg/m   Physical Exam  Constitutional: He is oriented to person, place, and time. He appears well-developed and well-nourished.  HENT:  Head: Normocephalic and atraumatic.  Eyes: EOM are normal.  Neck: Normal range of motion.  Cardiovascular: Normal rate, regular rhythm, normal heart sounds and intact distal pulses.  Pulmonary/Chest: Effort normal and breath sounds normal. No respiratory distress.  Abdominal: Soft. He exhibits no distension. There is no tenderness.  Musculoskeletal: Normal range of motion.  Neurological: He is alert and oriented to person, place, and time.  Skin: Skin is warm and dry.  Psychiatric: He has Steve  normal mood and affect. Judgment normal.  Nursing note and vitals reviewed.    ED Treatments / Results  Labs (all labs ordered are listed, but only abnormal results are displayed) Labs Reviewed  BASIC METABOLIC PANEL - Abnormal; Notable for the following components:      Result Value   Glucose, Bld 102 (*)    All other components within normal limits  CBC  TROPONIN I  TSH    EKG  EKG Interpretation  Date/Time:  Tuesday July 29 2017 11:23:30 EST Ventricular Rate:  84 PR Interval:  186 QRS Duration: 88 QT Interval:  348 QTC Calculation: 411 R Axis:   93 Text Interpretation:  Normal sinus rhythm Rightward axis Borderline ECG No significant change was found Confirmed by Azalia Bilisampos, Noami Bove ([id number]) on 07/29/2017 12:54:29 PM       Radiology Dg Chest 2 View  Result Date: 07/29/2017 CLINICAL DATA:  Palpitations. EXAM: CHEST  2 VIEW COMPARISON:  Chest x-ray dated October 02, 2015. FINDINGS: The heart size and mediastinal contours are within normal limits. Both lungs are clear. The visualized skeletal structures are unremarkable. IMPRESSION: No active cardiopulmonary disease. Electronically Signed   By: Obie DredgeWilliam T Derry M.D.   On: 07/29/2017 13:04    Procedures Procedures (including critical care time)  Medications Ordered in ED Medications - No data to display   Initial Impression / Assessment and Plan / ED Course  I have reviewed the triage vital signs and the nursing notes.  Pertinent labs & imaging results that were available during my care of the patient were reviewed by me and considered in my medical decision making (see chart for details).     Intermittent palpitations.  Patient will need outpatient cardiology follow-up.  Thyroid studies ordered.  This can be followed up on.  Likely benefit from outpatient echocardiogram and Holter monitoring.  Cardiology referral.  Patient understands return to the ER for new or worsening symptoms.  Workup in the emergency department is  otherwise without significant abnormality.  Final Clinical Impressions(s) / ED Diagnoses   Final diagnoses:  Palpitations    ED Discharge Orders        Ordered    metoprolol tartrate (LOPRESSOR) 25 MG tablet  2 times daily     07/29/17 1421       Azalia Bilisampos, Curt Oatis, MD 07/29/17 1423

## 2017-07-29 NOTE — ED Triage Notes (Addendum)
Pt reports hx of heart surgery as a child. States he was having palpitations last night. Pt reports heart "fluttering". States he has not been able to take his metoprolol due to not having insurance

## 2018-01-16 ENCOUNTER — Other Ambulatory Visit: Payer: Self-pay

## 2018-01-16 ENCOUNTER — Emergency Department (HOSPITAL_COMMUNITY): Payer: Self-pay

## 2018-01-16 ENCOUNTER — Emergency Department (HOSPITAL_COMMUNITY)
Admission: EM | Admit: 2018-01-16 | Discharge: 2018-01-16 | Disposition: A | Discharge status: Home / Self Care | Payer: Self-pay | Attending: Emergency Medicine | Admitting: Emergency Medicine

## 2018-01-16 ENCOUNTER — Encounter (HOSPITAL_COMMUNITY): Payer: Self-pay

## 2018-01-16 DIAGNOSIS — Z79899 Other long term (current) drug therapy: Secondary | ICD-10-CM | POA: Insufficient documentation

## 2018-01-16 DIAGNOSIS — F17200 Nicotine dependence, unspecified, uncomplicated: Secondary | ICD-10-CM | POA: Insufficient documentation

## 2018-01-16 DIAGNOSIS — S0003XA Contusion of scalp, initial encounter: Secondary | ICD-10-CM | POA: Insufficient documentation

## 2018-01-16 DIAGNOSIS — S060X1A Concussion with loss of consciousness of 30 minutes or less, initial encounter: Secondary | ICD-10-CM | POA: Insufficient documentation

## 2018-01-16 DIAGNOSIS — Y999 Unspecified external cause status: Secondary | ICD-10-CM | POA: Insufficient documentation

## 2018-01-16 DIAGNOSIS — Y92008 Other place in unspecified non-institutional (private) residence as the place of occurrence of the external cause: Secondary | ICD-10-CM | POA: Insufficient documentation

## 2018-01-16 DIAGNOSIS — Y939 Activity, unspecified: Secondary | ICD-10-CM | POA: Insufficient documentation

## 2018-01-16 MED ORDER — ACETAMINOPHEN 500 MG PO TABS: 1000.0000 mg | ORAL_TABLET | Freq: Three times a day (TID) | ORAL | 0 refills | Status: AC

## 2018-01-16 MED ORDER — ONDANSETRON HCL 4 MG/2ML IJ SOLN
4.0000 mg | Freq: Once | INTRAMUSCULAR | Status: AC
  Administered 2018-01-16: 4 mg via INTRAVENOUS
  Filled 2018-01-16: qty 2

## 2018-01-16 MED ORDER — SODIUM CHLORIDE 0.9 % IV BOLUS
500.0000 mL | Freq: Once | INTRAVENOUS | Status: AC
  Administered 2018-01-16: 500 mL via INTRAVENOUS

## 2018-01-16 MED ORDER — ONDANSETRON 4 MG PO TBDP: 4.0000 mg | ORAL_TABLET | Freq: Three times a day (TID) | ORAL | 0 refills | Status: AC | PRN

## 2018-01-16 MED ORDER — IBUPROFEN 600 MG PO TABS: 600.0000 mg | ORAL_TABLET | Freq: Four times a day (QID) | ORAL | 0 refills | Status: DC | PRN

## 2018-01-16 MED ORDER — HYDROMORPHONE HCL 1 MG/ML IJ SOLN
0.5000 mg | Freq: Once | INTRAMUSCULAR | Status: AC
  Administered 2018-01-16: 0.5 mg via INTRAVENOUS
  Filled 2018-01-16: qty 1

## 2018-01-16 NOTE — ED Notes (Signed)
Patient verbalizes understanding of discharge instructions. Opportunity for questioning and answers were provided. Armband removed by staff, pt discharged from ED ambulatory with male companion.  
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
## 2018-01-21 ENCOUNTER — Emergency Department (HOSPITAL_COMMUNITY)
Admission: EM | Admit: 2018-01-21 | Discharge: 2018-01-21 | Disposition: A | Discharge status: Home / Self Care | Payer: Self-pay | Attending: Emergency Medicine | Admitting: Emergency Medicine

## 2018-01-21 ENCOUNTER — Encounter (HOSPITAL_COMMUNITY): Payer: Self-pay | Admitting: Emergency Medicine

## 2018-01-21 ENCOUNTER — Emergency Department (HOSPITAL_COMMUNITY): Payer: Self-pay

## 2018-01-21 ENCOUNTER — Other Ambulatory Visit: Payer: Self-pay

## 2018-01-21 DIAGNOSIS — Y9389 Activity, other specified: Secondary | ICD-10-CM | POA: Insufficient documentation

## 2018-01-21 DIAGNOSIS — S52612A Displaced fracture of left ulna styloid process, initial encounter for closed fracture: Secondary | ICD-10-CM | POA: Insufficient documentation

## 2018-01-21 DIAGNOSIS — M542 Cervicalgia: Secondary | ICD-10-CM | POA: Insufficient documentation

## 2018-01-21 DIAGNOSIS — Z8679 Personal history of other diseases of the circulatory system: Secondary | ICD-10-CM | POA: Insufficient documentation

## 2018-01-21 DIAGNOSIS — S30810A Abrasion of lower back and pelvis, initial encounter: Secondary | ICD-10-CM | POA: Insufficient documentation

## 2018-01-21 DIAGNOSIS — Y998 Other external cause status: Secondary | ICD-10-CM | POA: Insufficient documentation

## 2018-01-21 DIAGNOSIS — M545 Low back pain: Secondary | ICD-10-CM | POA: Insufficient documentation

## 2018-01-21 DIAGNOSIS — S0990XA Unspecified injury of head, initial encounter: Secondary | ICD-10-CM | POA: Insufficient documentation

## 2018-01-21 DIAGNOSIS — S0031XA Abrasion of nose, initial encounter: Secondary | ICD-10-CM | POA: Insufficient documentation

## 2018-01-21 DIAGNOSIS — Y92410 Unspecified street and highway as the place of occurrence of the external cause: Secondary | ICD-10-CM | POA: Insufficient documentation

## 2018-01-21 DIAGNOSIS — T148XXA Other injury of unspecified body region, initial encounter: Secondary | ICD-10-CM

## 2018-01-21 DIAGNOSIS — Z23 Encounter for immunization: Secondary | ICD-10-CM | POA: Insufficient documentation

## 2018-01-21 HISTORY — DX: Nonrheumatic mitral (valve) prolapse: I34.1

## 2018-01-21 LAB — COMPREHENSIVE METABOLIC PANEL
ALK PHOS: 49 U/L (ref 38–126)
ALT: 37 U/L (ref 0–44)
ANION GAP: 12 (ref 5–15)
AST: 40 U/L (ref 15–41)
Albumin: 4.1 g/dL (ref 3.5–5.0)
BUN: 7 mg/dL (ref 6–20)
CALCIUM: 9.1 mg/dL (ref 8.9–10.3)
CO2: 25 mmol/L (ref 22–32)
Chloride: 105 mmol/L (ref 98–111)
Creatinine, Ser: 1.01 mg/dL (ref 0.61–1.24)
GFR calc non Af Amer: >60 mL/min (ref >60)
Glucose, Bld: 104 mg/dL — ABNORMAL HIGH (ref 70–99)
POTASSIUM: 4 mmol/L (ref 3.5–5.1)
SODIUM: 142 mmol/L (ref 135–145)
TOTAL PROTEIN: 6.5 g/dL (ref 6.5–8.1)
Total Bilirubin: 0.7 mg/dL (ref 0.3–1.2)

## 2018-01-21 LAB — SAMPLE TO BLOOD BANK

## 2018-01-21 LAB — I-STAT CHEM 8, ED
BUN: 8 mg/dL (ref 6–20)
CALCIUM ION: 1.14 mmol/L — AB (ref 1.15–1.40)
CHLORIDE: 103 mmol/L (ref 98–111)
Creatinine, Ser: 1.1 mg/dL (ref 0.61–1.24)
GLUCOSE: 101 mg/dL — AB (ref 70–99)
HCT: 47 % (ref 39.0–52.0)
Hemoglobin: 16 g/dL (ref 13.0–17.0)
Potassium: 4 mmol/L (ref 3.5–5.1)
Sodium: 143 mmol/L (ref 135–145)
TCO2: 27 mmol/L (ref 22–32)

## 2018-01-21 LAB — CBC
HCT: 48.8 % (ref 39.0–52.0)
HEMOGLOBIN: 16.4 g/dL (ref 13.0–17.0)
MCH: 31.2 pg (ref 26.0–34.0)
MCHC: 33.6 g/dL (ref 30.0–36.0)
MCV: 93 fL (ref 78.0–100.0)
Platelets: 267 10*3/uL (ref 150–400)
RBC: 5.25 MIL/uL (ref 4.22–5.81)
RDW: 12.9 % (ref 11.5–15.5)
WBC: 5.1 10*3/uL (ref 4.0–10.5)

## 2018-01-21 LAB — PROTIME-INR
INR: 0.99
Prothrombin Time: 13 seconds (ref 11.4–15.2)

## 2018-01-21 LAB — ETHANOL: ALCOHOL ETHYL (B): 47 mg/dL — AB (ref <10)

## 2018-01-21 LAB — CDS SEROLOGY

## 2018-01-21 LAB — I-STAT CG4 LACTIC ACID, ED: LACTIC ACID, VENOUS: 1.96 mmol/L — AB (ref 0.5–1.9)

## 2018-01-21 MED ORDER — IOHEXOL 300 MG/ML  SOLN
100.0000 mL | Freq: Once | INTRAMUSCULAR | Status: AC | PRN
  Administered 2018-01-21: 100 mL via INTRAVENOUS

## 2018-01-21 MED ORDER — FENTANYL CITRATE (PF) 100 MCG/2ML IJ SOLN
INTRAMUSCULAR | Status: AC
  Filled 2018-01-21: qty 2

## 2018-01-21 MED ORDER — IBUPROFEN 600 MG PO TABS: 600.0000 mg | ORAL_TABLET | Freq: Four times a day (QID) | ORAL | 0 refills | Status: DC | PRN

## 2018-01-21 MED ORDER — TETANUS-DIPHTH-ACELL PERTUSSIS 5-2.5-18.5 LF-MCG/0.5 IM SUSP
0.5000 mL | Freq: Once | INTRAMUSCULAR | Status: AC
  Administered 2018-01-21: 0.5 mL via INTRAMUSCULAR
  Filled 2018-01-21: qty 0.5

## 2018-01-21 MED ORDER — FENTANYL CITRATE (PF) 100 MCG/2ML IJ SOLN
INTRAMUSCULAR | Status: AC | PRN
  Administered 2018-01-21: 50 ug via INTRAVENOUS

## 2018-01-21 MED ORDER — METHOCARBAMOL 500 MG PO TABS: 1000.0000 mg | ORAL_TABLET | Freq: Three times a day (TID) | ORAL | 0 refills | Status: DC | PRN
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
## 2018-01-21 NOTE — ED Notes (Signed)
Steve SchillerJames Stanley (Father) [phone]

## 2018-01-21 NOTE — ED Notes (Signed)
Pt remains in imaging.

## 2018-01-21 NOTE — ED Notes (Signed)
Paper scrubs given.  

## 2018-01-21 NOTE — ED Notes (Signed)
Patient transported to X-ray 
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
## 2018-01-22 ENCOUNTER — Encounter (HOSPITAL_COMMUNITY): Payer: Self-pay

## 2018-07-06 ENCOUNTER — Emergency Department (HOSPITAL_COMMUNITY): Payer: Self-pay

## 2018-07-06 ENCOUNTER — Other Ambulatory Visit: Payer: Self-pay

## 2018-07-06 ENCOUNTER — Encounter (HOSPITAL_COMMUNITY): Payer: Self-pay | Admitting: Emergency Medicine

## 2018-07-06 ENCOUNTER — Emergency Department (HOSPITAL_COMMUNITY)
Admission: EM | Admit: 2018-07-06 | Discharge: 2018-07-06 | Disposition: A | Discharge status: Home / Self Care | Payer: Self-pay | Attending: Emergency Medicine | Admitting: Emergency Medicine

## 2018-07-06 DIAGNOSIS — Z79899 Other long term (current) drug therapy: Secondary | ICD-10-CM | POA: Insufficient documentation

## 2018-07-06 DIAGNOSIS — R109 Unspecified abdominal pain: Secondary | ICD-10-CM

## 2018-07-06 DIAGNOSIS — R1011 Right upper quadrant pain: Secondary | ICD-10-CM | POA: Insufficient documentation

## 2018-07-06 LAB — COMPREHENSIVE METABOLIC PANEL
ALBUMIN: 5.3 g/dL — AB (ref 3.5–5.0)
ALT: 50 U/L — AB (ref 0–44)
AST: 43 U/L — AB (ref 15–41)
Alkaline Phosphatase: 62 U/L (ref 38–126)
Anion gap: 16 — ABNORMAL HIGH (ref 5–15)
BUN: 21 mg/dL — AB (ref 6–20)
CHLORIDE: 102 mmol/L (ref 98–111)
CO2: 19 mmol/L — ABNORMAL LOW (ref 22–32)
Calcium: 9.1 mg/dL (ref 8.9–10.3)
Creatinine, Ser: 1.06 mg/dL (ref 0.61–1.24)
GFR calc Af Amer: >60 mL/min (ref >60)
Glucose, Bld: 103 mg/dL — ABNORMAL HIGH (ref 70–99)
POTASSIUM: 4.1 mmol/L (ref 3.5–5.1)
Sodium: 137 mmol/L (ref 135–145)
Total Bilirubin: 0.9 mg/dL (ref 0.3–1.2)
Total Protein: 7.8 g/dL (ref 6.5–8.1)

## 2018-07-06 LAB — URINALYSIS, ROUTINE W REFLEX MICROSCOPIC
BACTERIA UA: NONE SEEN
Bilirubin Urine: NEGATIVE
Glucose, UA: NEGATIVE mg/dL
KETONES UR: 80 mg/dL — AB
LEUKOCYTES UA: NEGATIVE
NITRITE: NEGATIVE
PH: 5 (ref 5.0–8.0)
PROTEIN: NEGATIVE mg/dL
Specific Gravity, Urine: 1.019 (ref 1.005–1.030)

## 2018-07-06 LAB — CBC
HEMATOCRIT: 45.2 % (ref 39.0–52.0)
HEMOGLOBIN: 15.8 g/dL (ref 13.0–17.0)
MCH: 32 pg (ref 26.0–34.0)
MCHC: 35 g/dL (ref 30.0–36.0)
MCV: 91.7 fL (ref 80.0–100.0)
Platelets: 307 10*3/uL (ref 150–400)
RBC: 4.93 MIL/uL (ref 4.22–5.81)
RDW: 12.3 % (ref 11.5–15.5)
WBC: 15.2 10*3/uL — AB (ref 4.0–10.5)
nRBC: 0 % (ref 0.0–0.2)

## 2018-07-06 LAB — LIPASE, BLOOD: Lipase: 27 U/L (ref 11–51)

## 2018-07-06 MED ORDER — KETOROLAC TROMETHAMINE 15 MG/ML IJ SOLN
15.0000 mg | Freq: Once | INTRAMUSCULAR | Status: AC
  Administered 2018-07-06: 15 mg via INTRAVENOUS
  Filled 2018-07-06: qty 1

## 2018-07-06 MED ORDER — HYDROMORPHONE HCL 1 MG/ML IJ SOLN
0.5000 mg | Freq: Once | INTRAMUSCULAR | Status: AC
  Administered 2018-07-06: 0.5 mg via INTRAVENOUS
  Filled 2018-07-06: qty 1

## 2018-07-06 MED ORDER — SODIUM CHLORIDE 0.9 % IV BOLUS
1000.0000 mL | Freq: Once | INTRAVENOUS | Status: AC
  Administered 2018-07-06: 1000 mL via INTRAVENOUS

## 2018-07-06 MED ORDER — NAPROXEN 500 MG PO TABS: 500.0000 mg | ORAL_TABLET | Freq: Two times a day (BID) | ORAL | 0 refills | Status: DC

## 2018-07-06 MED ORDER — HYDROCODONE-ACETAMINOPHEN 5-325 MG PO TABS: ORAL_TABLET | ORAL | 0 refills | Status: DC

## 2018-07-06 MED ORDER — ONDANSETRON HCL 4 MG/2ML IJ SOLN
4.0000 mg | Freq: Once | INTRAMUSCULAR | Status: AC | PRN
  Administered 2018-07-06: 4 mg via INTRAVENOUS
  Filled 2018-07-06: qty 2

## 2018-07-06 MED ORDER — HYDROMORPHONE HCL 1 MG/ML IJ SOLN
0.5000 mg | Freq: Once | INTRAMUSCULAR | Status: AC
Start: 2018-07-06 — End: 2018-07-06
  Administered 2018-07-06: 0.5 mg via INTRAVENOUS
  Filled 2018-07-06: qty 1

## 2018-07-06 NOTE — ED Triage Notes (Signed)
Pt complaint of severe right back pain onset 7 hours ago; associated emesis; hx of kidney stones.

## 2018-07-06 NOTE — ED Provider Notes (Signed)
[facility]-EMERGENCY DEPT Provider Note   CSN: [id number] Arrival date & time: 07/06/18  4098     History   Chief Complaint Chief Complaint  Patient presents with  . Back Pain  . Emesis    HPI Steve Stanley is a 33 y.o. male.  Patient with history of kidney stone presents with complaint of acute onset, severe right flank pain early this morning with associated vomiting.  Patient denies any abdominal pain or fever.  No urinary symptoms.  States kidney stone in the past however he never had pain like this.  No previous abdominal surgeries.  No treatments prior to arrival.      Past Medical History:  Diagnosis Date  . Anxiety   . Atrial septal defect   . Depression   . Heart disease, unspecified   . Heartburn   . Mitral valve prolapse     There are no active problems to display for this patient.   Past Surgical History:  Procedure Laterality Date  . CARDIAC SURGERY     PDA   . HERNIA REPAIR  05/01/2012   rih repair  . PATENT DUCTUS ARTERIOUS REPAIR          Home Medications    Prior to Admission medications   Medication Sig Start Date End Date Taking? Authorizing Provider  albuterol (PROVENTIL HFA;VENTOLIN HFA) 108 (90 Base) MCG/ACT inhaler Inhale 2 puffs into the lungs every 4 (four) hours as needed for wheezing or shortness of breath. Patient not taking: Reported on 01/16/2018 08/19/16   Hayden Rasmussen, NP  benzonatate (TESSALON) 100 MG capsule Take 1 capsule (100 mg total) by mouth 3 (three) times daily as needed for cough. Patient not taking: Reported on 01/16/2018 06/28/16   Sudie Grumbling, NP  diltiazem 2 % GEL Apply lightly to area tid x 8 weeks Patient not taking: Reported on 01/16/2018 11/23/14   Riki Sheer, PA-C  ibuprofen (ADVIL,MOTRIN) 600 MG tablet Take 1 tablet (600 mg total) by mouth every 6 (six) hours as needed. 01/16/18   Nira Conn, MD  ibuprofen (ADVIL,MOTRIN) 600 MG tablet Take 1 tablet (600 mg total) by  mouth every 6 (six) hours as needed. 01/21/18   Loren Racer, MD  methocarbamol (ROBAXIN) 500 MG tablet Take 2 tablets (1,000 mg total) by mouth every 8 (eight) hours as needed for muscle spasms. 01/21/18   Loren Racer, MD  metoprolol tartrate (LOPRESSOR) 25 MG tablet Take 0.5 tablets (12.5 mg total) by mouth 2 (two) times daily. Patient not taking: Reported on 01/16/2018 07/29/17   Azalia Bilis, MD  metoprolol tartrate (LOPRESSOR) 25 MG tablet Take 25 mg by mouth 2 (two) times daily.    [provider]  omeprazole (PRILOSEC) 20 MG capsule Take 1 capsule (20 mg total) by mouth daily. Patient not taking: Reported on 01/16/2018 10/02/15   Lyndal Pulley, MD  predniSONE (DELTASONE) 50 MG tablet 1 tab po daily for 6 days. Take with food. Patient not taking: Reported on 01/16/2018 08/19/16   Hayden Rasmussen, NP    Family History No family history on file.  Social History Social History   Tobacco Use  . Smoking status: Never Smoker  . Smokeless tobacco: Never Used  Substance Use Topics  . Alcohol use: Yes    Comment: DAILY   . Drug use: Yes    Types: Marijuana     Allergies   Benadryl [diphenhydramine] and Benadryl [diphenhydramine hcl]   Review of Systems Review of Systems  Constitutional: Negative for fever.  HENT: Negative for rhinorrhea and sore throat.   Eyes: Negative for redness.  Respiratory: Negative for cough.   Cardiovascular: Negative for chest pain.  Gastrointestinal: Positive for nausea and vomiting. Negative for abdominal pain and diarrhea.  Genitourinary: Positive for flank pain. Negative for dysuria and hematuria.  Musculoskeletal: Negative for myalgias.  Skin: Negative for rash.  Neurological: Negative for headaches.     Physical Exam Updated Vital Signs BP (!) 144/85 (BP Location: Left Arm)   Pulse (!) 138   Temp 97.8 F (36.6 C) (Oral)   Resp 20   Ht 5\' 4"  (1.626 m)   Wt 56.7 kg   SpO2 96%   BMI 21.46 kg/m   Physical Exam Vitals signs  and nursing note reviewed.  Constitutional:      General: He is in acute distress.     Appearance: He is well-developed.  HENT:     Head: Normocephalic and atraumatic.  Eyes:     General:        Right eye: No discharge.        Left eye: No discharge.     Conjunctiva/sclera: Conjunctivae normal.  Neck:     Musculoskeletal: Normal range of motion and neck supple.  Cardiovascular:     Rate and Rhythm: Regular rhythm. Tachycardia present.     Heart sounds: Normal heart sounds.  Pulmonary:     Effort: Pulmonary effort is normal.     Breath sounds: Normal breath sounds.  Abdominal:     Palpations: Abdomen is soft.     Tenderness: There is no abdominal tenderness.  Skin:    General: Skin is warm and dry.  Neurological:     Mental Status: He is alert.      ED Treatments / Results  Labs (all labs ordered are listed, but only abnormal results are displayed) Labs Reviewed  COMPREHENSIVE METABOLIC PANEL - Abnormal; Notable for the following components:      Result Value   CO2 19 (*)    Glucose, Bld 103 (*)    BUN 21 (*)    Albumin 5.3 (*)    AST 43 (*)    ALT 50 (*)    Anion gap 16 (*)    All other components within normal limits  CBC - Abnormal; Notable for the following components:   WBC 15.2 (*)    All other components within normal limits  URINALYSIS, ROUTINE W REFLEX MICROSCOPIC - Abnormal; Notable for the following components:   Hgb urine dipstick SMALL (*)    Ketones, ur 80 (*)    All other components within normal limits  LIPASE, BLOOD    EKG None  Radiology Ct Renal Stone Study  Result Date: 07/06/2018 CLINICAL DATA:  Acute right flank pain. EXAM: CT ABDOMEN AND PELVIS WITHOUT CONTRAST TECHNIQUE: Multidetector CT imaging of the abdomen and pelvis was performed following the standard protocol without IV contrast. COMPARISON:  CT scan of Nov 01, 2017. FINDINGS: Lower chest: No acute abnormality. Hepatobiliary: No gallstones or biliary dilatation is noted.  Probable fatty infiltration of the liver. Pancreas: Unremarkable. No pancreatic ductal dilatation or surrounding inflammatory changes. Spleen: Normal in size without focal abnormality. Adrenals/Urinary Tract: Adrenal glands appear normal. Small nonobstructive left renal calculus is noted. No hydronephrosis or renal obstruction is noted. No ureteral calculi are noted. Urinary bladder is unremarkable. Stomach/Bowel: Stomach is within normal limits. Appendix appears normal. No evidence of bowel wall thickening, distention, or inflammatory changes. Vascular/Lymphatic: No significant vascular  findings are present. No enlarged abdominal or pelvic lymph nodes. Reproductive: Prostate is unremarkable. Other: No abdominal wall hernia or abnormality. No abdominopelvic ascites. Musculoskeletal: No acute or significant osseous findings. IMPRESSION: Small nonobstructive left renal calculus. No hydronephrosis or renal obstruction is noted. Probable hepatic steatosis. Electronically Signed   By: Lupita RaiderJames  Green Jr, M.D.   On: 07/06/2018 12:01    Procedures Procedures (including critical care time)  Medications Ordered in ED Medications  ondansetron (ZOFRAN) injection 4 mg (4 mg Intravenous Given 07/06/18 0938)  HYDROmorphone (DILAUDID) injection 0.5 mg (0.5 mg Intravenous Given 07/06/18 0944)  sodium chloride 0.9 % bolus 1,000 mL (0 mLs Intravenous Stopped 07/06/18 1059)  HYDROmorphone (DILAUDID) injection 0.5 mg (0.5 mg Intravenous Given 07/06/18 1125)  ketorolac (TORADOL) 15 MG/ML injection 15 mg (15 mg Intravenous Given 07/06/18 1239)     Initial Impression / Assessment and Plan / ED Course  I have reviewed the triage vital signs and the nursing notes.  Pertinent labs & imaging results that were available during my care of the patient were reviewed by me and considered in my medical decision making (see chart for details).     Patient seen and examined.  Patient very uncontrollable appearing with severe pain.   Will evaluate for renal stone.  Labs ordered.  Nausea and pain medication ordered.  Will give fluid bolus.   Vital signs reviewed and are as follows: BP (!) 144/85 (BP Location: Left Arm)   Pulse (!) 138   Temp 97.8 F (36.6 C) (Oral)   Resp 20   Ht 5\' 4"  (1.626 m)   Wt 56.7 kg   SpO2 96%   BMI 21.46 kg/m   1:20 PM patient looks much more comfortable now.  He has received IV fluids.  CT imaging without ureteral stone, other abnormality.  Feel that he can be discharged home at this time with medication for pain.  The patient was urged to return to the Emergency Department immediately with worsening of current symptoms, worsening abdominal pain, persistent vomiting, blood noted in stools, fever, or any other concerns. The patient verbalized understanding.   BP 119/61   Pulse (!) 101   Temp 97.8 F (36.6 C) (Oral)   Resp 18   Ht 5\' 4"  (1.626 m)   Wt 56.7 kg   SpO2 96%   BMI 21.46 kg/m    Patient counseled on use of narcotic pain medications. Counseled not to combine these medications with others containing tylenol. Urged not to drink alcohol, drive, or perform any other activities that requires focus while taking these medications. The patient verbalizes understanding and agrees with the plan.   Final Clinical Impressions(s) / ED Diagnoses   Final diagnoses:  Flank pain   Patient with waves of severe flank pain earlier today, now improved.  Clinical exam consistent with ureteral colic, however no definite stone noted on CT and the sequelae of recent stone.  Query passed stone?  Elevated white blood cell count likely reactive due to pain.  No intra-abdominal infections noted on CT imaging.  Symptoms now well controlled and patient is back to his baseline without any signs of discomfort.  We will discharge to home with symptomatic treatment.  Patient comfortable with plan.  Return instructions as above.  ED Discharge Orders         Ordered    HYDROcodone-acetaminophen  (NORCO/VICODIN) 5-325 MG tablet  Status:  Discontinued     07/06/18 1316    naproxen (NAPROSYN) 500 MG tablet  2  times daily,   Status:  Discontinued     07/06/18 1316    HYDROcodone-acetaminophen (NORCO/VICODIN) 5-325 MG tablet     07/06/18 1317    naproxen (NAPROSYN) 500 MG tablet  2 times daily     07/06/18 1317           Renne CriglerGeiple, Roberto Hlavaty, PA-C 07/06/18 1322    Azalia Bilisampos, Kevin, MD 07/06/18 [phone]

## 2018-07-06 NOTE — Discharge Instructions (Signed)
Please read and follow all provided instructions.  Your diagnoses today include:  1. Flank pain     Tests performed today include:  Urine test that showed no infection  CT scan which did not show a definite stone or other problems  Blood test that showed normal kidney function  Vital signs. See below for your results today.   Medications prescribed:   Vicodin (hydrocodone/acetaminophen) - narcotic pain medication  DO NOT drive or perform any activities that require you to be awake and alert because this medicine can make you drowsy. BE VERY CAREFUL not to take multiple medicines containing Tylenol (also called acetaminophen). Doing so can lead to an overdose which can damage your liver and cause liver failure and possibly death.   Naproxen - anti-inflammatory pain medication  Do not exceed 500mg  naproxen every 12 hours, take with food  You have been prescribed an anti-inflammatory medication or NSAID. Take with food. Take smallest effective dose for the shortest duration needed for your pain. Stop taking if you experience stomach pain or vomiting.   Take any prescribed medications only as directed.  Home care instructions:  Follow any educational materials contained in this packet.  Please double your fluid intake for the next several days.   BE VERY CAREFUL not to take multiple medicines containing Tylenol (also called acetaminophen). Doing so can lead to an overdose which can damage your liver and cause liver failure and possibly death.   Follow-up instructions: Follow-up with your doctor as needed.  Return instructions:   Please return to the Emergency Department if you experience worsening symptoms.  Please return if you develop fever or uncontrolled pain or vomiting.  Please return if you have any other emergent concerns.  Additional Information:  Your vital signs today were: BP 119/61    Pulse (!) 101    Temp 97.8 F (36.6 C) (Oral)    Resp 18    Ht 5\' 4"   (1.626 m)    Wt 56.7 kg    SpO2 96%    BMI 21.46 kg/m  If your blood pressure (BP) was elevated above 135/85 this visit, please have this repeated by your doctor within one month. --------------

## 2018-12-03 ENCOUNTER — Ambulatory Visit (HOSPITAL_COMMUNITY)
Admission: EM | Admit: 2018-12-03 | Discharge: 2018-12-03 | Disposition: A | Discharge status: Home / Self Care | Payer: Self-pay | Attending: Family Medicine | Admitting: Family Medicine

## 2018-12-03 ENCOUNTER — Encounter (HOSPITAL_COMMUNITY): Payer: Self-pay

## 2018-12-03 ENCOUNTER — Other Ambulatory Visit: Payer: Self-pay

## 2018-12-03 DIAGNOSIS — T25211A Burn of second degree of right ankle, initial encounter: Secondary | ICD-10-CM

## 2018-12-03 MED ORDER — SILVER SULFADIAZINE 1 % EX CREA: 1.0000 "application " | TOPICAL_CREAM | Freq: Every day | CUTANEOUS | 0 refills | Status: DC

## 2018-12-03 MED ORDER — SILVER SULFADIAZINE 1 % EX CREA
TOPICAL_CREAM | Freq: Once | CUTANEOUS | Status: AC
  Administered 2018-12-03: 19:00:00 via TOPICAL

## 2018-12-03 MED ORDER — AMOXICILLIN-POT CLAVULANATE 875-125 MG PO TABS: 1.0000 | ORAL_TABLET | Freq: Two times a day (BID) | ORAL | 0 refills | Status: DC
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
## 2019-11-09 ENCOUNTER — Other Ambulatory Visit: Payer: Self-pay

## 2019-11-09 ENCOUNTER — Ambulatory Visit (HOSPITAL_COMMUNITY)
Admission: EM | Admit: 2019-11-09 | Discharge: 2019-11-09 | Disposition: A | Discharge status: Home / Self Care | Payer: Self-pay | Attending: Family Medicine | Admitting: Family Medicine

## 2019-11-09 ENCOUNTER — Encounter (HOSPITAL_COMMUNITY): Payer: Self-pay

## 2019-11-09 DIAGNOSIS — M25511 Pain in right shoulder: Secondary | ICD-10-CM

## 2019-11-09 MED ORDER — HYDROCODONE-ACETAMINOPHEN 5-325 MG PO TABS: 1.0000 | ORAL_TABLET | Freq: Four times a day (QID) | ORAL | 0 refills | Status: DC | PRN

## 2019-11-09 MED ORDER — PREDNISONE 10 MG (21) PO TBPK: ORAL_TABLET | Freq: Every day | ORAL | 0 refills | Status: DC

## 2019-11-09 MED ORDER — DICLOFENAC SODIUM 75 MG PO TBEC: 75.0000 mg | DELAYED_RELEASE_TABLET | Freq: Two times a day (BID) | ORAL | 0 refills | Status: DC

## 2019-11-09 NOTE — Discharge Instructions (Signed)

## 2019-11-09 NOTE — ED Provider Notes (Signed)
[facility]   [id number] 11/09/19 Arrival Time: 1551  ASSESSMENT & PLAN:  1. Acute pain of right shoulder     No indications for plain imaging at this time. Question biceps tendonitis. Discussed.  Begin: Meds ordered this encounter  Medications  . diclofenac (VOLTAREN) 75 MG EC tablet    Sig: Take 1 tablet (75 mg total) by mouth 2 (two) times daily.    Dispense:  14 tablet    Refill:  0  . HYDROcodone-acetaminophen (NORCO/VICODIN) 5-325 MG tablet    Sig: Take 1 tablet by mouth every 6 (six) hours as needed for moderate pain or severe pain.    Dispense:  8 tablet    Refill:  0  . predniSONE (STERAPRED UNI-PAK 21 TAB) 10 MG (21) TBPK tablet    Sig: Take by mouth daily. Take as directed.    Dispense:  21 tablet    Refill:  0    Recommend: Follow-up Information    [facility].   Why: If worsening or failing to improve as anticipated. Contact information: [address] [place] [address] [phone]          Activities as tolerated. Declines work note.  [place] Controlled Substances Registry consulted for this patient. I feel the risk/benefit ratio today is favorable for proceeding with this prescription for a controlled substance. Medication sedation precautions given.  Reviewed expectations re: course of current medical issues. Questions answered. Outlined signs and symptoms indicating need for more acute intervention. Patient verbalized understanding. After Visit Summary given.  SUBJECTIVE: History from: patient. Steve Stanley is a 34 y.o. male who reports fairly persistent marked pain of his right anterior shoulder area; described as aching; without radiation. Onset: abrupt. First noted: today. Injury/trama: no. Symptoms have progressed to a point and plateaued since beginning. Aggravating factors: certain movements. Alleviating factors: have not been identified. Associated symptoms: none  reported. Extremity sensation changes or weakness: none. Self treatment: NSAID, with minimal relief.  History of similar: no.  Past Surgical History:  Procedure Laterality Date  . CARDIAC SURGERY     PDA   . HERNIA REPAIR  05/01/2012   rih repair  . PATENT DUCTUS ARTERIOUS REPAIR        OBJECTIVE:  Vitals:   11/09/19 1623 11/09/19 1625  BP:  131/86  Pulse:  73  Resp:  17  Temp:  98.4 F (36.9 C)  TempSrc:  Oral  SpO2:  99%  Weight: 56.7 kg     General appearance: alert; no distress HEENT: [place]; AT Neck: supple with FROM Resp: unlabored respirations Extremities: . RUE: warm with well perfused appearance; poorly locali . zed marked tenderness over right anterior shoulder, more so around proximal biceps insertion; without gross deformities; swelling: none; bruising: none; shoulder ROM: limited by reported pain CV: brisk extremity capillary refill of RUE; 2+ radial pulse of RUE. Skin: warm and dry; no visible rashes Neurologic: gait normal; normal sensation and strength of RUE Psychological: alert and cooperative; normal mood and affect    Allergies  Allergen Reactions  . Benadryl [Diphenhydramine] Other (See Comments)    Heart races     Past Medical History:  Diagnosis Date  . Anxiety   . Atrial septal defect   . Depression   . Heart disease, unspecified   . Heartburn   . Mitral valve prolapse    Social History   Socioeconomic History  . Marital status: Single    Spouse name: Not  on file  . Number of children: Not on file  . Years of education: Not on file  . Highest education level: Not on file  Occupational History  . Not on file  Tobacco Use  . Smoking status: Never Smoker  . Smokeless tobacco: Never Used  Substance and Sexual Activity  . Alcohol use: Yes    Comment: DAILY   . Drug use: Yes    Types: Marijuana  . Sexual activity: Yes    Birth control/protection: Condom  Other Topics Concern  . Not on file  Social History Narrative   **  Merged History Encounter **       Social Determinants of Health   Financial Resource Strain:   . Difficulty of Paying Living Expenses:   Food Insecurity:   . Worried About Charity fundraiser in the Last Year:   . Arboriculturist in the Last Year:   Transportation Needs:   . Film/video editor (Medical):   Marland Kitchen Lack of Transportation (Non-Medical):   Physical Activity:   . Days of Exercise per Week:   . Minutes of Exercise per Session:   Stress:   . Feeling of Stress :   Social Connections:   . Frequency of Communication with Friends and Family:   . Frequency of Social Gatherings with Friends and Family:   . Attends Religious Services:   . Active Member of Clubs or Organizations:   . Attends Archivist Meetings:   Marland Kitchen Marital Status:    Family History  Problem Relation Age of Onset  . Diabetes Mother   . Diabetes Father   . Hypertension Father   . Heart disease Father    Past Surgical History:  Procedure Laterality Date  . CARDIAC SURGERY     PDA   . HERNIA REPAIR  05/01/2012   rih repair  . PATENT DUCTUS ARTERIOUS REPAIR        Vanessa Kick, MD 11/10/19 [phone]

## 2019-11-09 NOTE — ED Triage Notes (Signed)
Pt is here with right shoulder pain that started this morning, pt has taken Advil to relieve discomfort.

## 2019-11-17 ENCOUNTER — Ambulatory Visit: Payer: Self-pay | Admitting: Sports Medicine

## 2020-04-16 ENCOUNTER — Other Ambulatory Visit: Payer: Self-pay

## 2020-04-16 ENCOUNTER — Ambulatory Visit (HOSPITAL_COMMUNITY)
Admission: EM | Admit: 2020-04-16 | Discharge: 2020-04-16 | Disposition: A | Discharge status: Home / Self Care | Payer: Self-pay | Attending: Family Medicine | Admitting: Family Medicine

## 2020-04-16 DIAGNOSIS — J069 Acute upper respiratory infection, unspecified: Secondary | ICD-10-CM | POA: Insufficient documentation

## 2020-04-16 DIAGNOSIS — Z20822 Contact with and (suspected) exposure to covid-19: Secondary | ICD-10-CM | POA: Insufficient documentation

## 2020-04-16 LAB — SARS CORONAVIRUS 2 (TAT 6-24 HRS): SARS Coronavirus 2: NEGATIVE

## 2020-04-16 NOTE — ED Provider Notes (Signed)
[facility]    CSN: [id number] Arrival date & time: 04/16/20  1159      History   Chief Complaint Chief Complaint  Patient presents with  . Nasal Congestion     since tuesday     HPI Steve Stanley is a 34 y.o. male.   HPI  Patient states he thinks he has a cold.  Some runny nose and stuffy nose.  He states is been going on for couple of days.  There is a new baby in the house, not quite a week old.  He is concerned about giving the baby's infection.  He has not had Covid vaccinations.  He is here requesting Covid testing No fever chills No headache or body aches No change in taste or smell  Past Medical History:  Diagnosis Date  . Anxiety   . Atrial septal defect   . Depression   . Heart disease, unspecified   . Heartburn   . Mitral valve prolapse     There are no problems to display for this patient.   Past Surgical History:  Procedure Laterality Date  . CARDIAC SURGERY     PDA   . HERNIA REPAIR  05/01/2012   rih repair  . PATENT DUCTUS ARTERIOUS REPAIR         Home Medications    Prior to Admission medications   Medication Sig Start Date End Date Taking? Authorizing Provider  diphenhydrAMINE-PE-APAP (ALLERGY RELIEF PLUS SINUS PO) Take by mouth. 2 tabs   Yes [provider]  Pseudoeph-Doxylamine-DM-APAP (NYQUIL PO) Take by mouth.   Yes [provider]  amoxicillin-clavulanate (AUGMENTIN) 875-125 MG tablet Take 1 tablet by mouth every 12 (twelve) hours. 12/03/18   Mardella Layman, MD  diclofenac (VOLTAREN) 75 MG EC tablet Take 1 tablet (75 mg total) by mouth 2 (two) times daily. 11/09/19   Mardella Layman, MD  HYDROcodone-acetaminophen (NORCO/VICODIN) 5-325 MG tablet Take 1 tablet by mouth every 6 (six) hours as needed for moderate pain or severe pain. 11/09/19   Mardella Layman, MD  predniSONE (STERAPRED UNI-PAK 21 TAB) 10 MG (21) TBPK tablet Take by mouth daily. Take as directed. 11/09/19   Mardella Layman, MD  silver sulfADIAZINE  (SILVADENE) 1 % cream Apply 1 application topically daily. 12/03/18   Mardella Layman, MD    Family History Family History  Problem Relation Age of Onset  . Diabetes Mother   . Diabetes Father   . Hypertension Father   . Heart disease Father     Social History Social History   Tobacco Use  . Smoking status: Never Smoker  . Smokeless tobacco: Never Used  Substance Use Topics  . Alcohol use: Yes    Comment: DAILY   . Drug use: Yes    Types: Marijuana     Allergies   Benadryl [diphenhydramine]   Review of Systems Review of Systems See HPI  Physical Exam Triage Vital Signs ED Triage Vitals [04/16/20 1344]  Enc Vitals Group     BP      Pulse      Resp      Temp      Temp src      SpO2      Weight      Height      Head Circumference      Peak Flow      Pain Score 0     Pain Loc      Pain Edu?  Excl. in GC?    No data found.  Updated Vital Signs There were no vitals taken for this visit.     Physical Exam Constitutional:      General: He is not in acute distress.    Appearance: Normal appearance. He is well-developed and normal weight.  HENT:     Head: Normocephalic and atraumatic.     Nose: Congestion present.     Mouth/Throat:     Pharynx: No posterior oropharyngeal erythema.  Eyes:     Conjunctiva/sclera: Conjunctivae normal.     Pupils: Pupils are equal, round, and reactive to light.  Cardiovascular:     Rate and Rhythm: Normal rate and regular rhythm.     Heart sounds: Normal heart sounds.  Pulmonary:     Effort: Pulmonary effort is normal. No respiratory distress.     Breath sounds: Normal breath sounds. No wheezing or rales.  Abdominal:     General: There is no distension.     Palpations: Abdomen is soft.  Musculoskeletal:        General: Normal range of motion.     Cervical back: Normal range of motion.  Skin:    General: Skin is warm and dry.  Neurological:     Mental Status: He is alert.      UC Treatments / Results   Labs (all labs ordered are listed, but only abnormal results are displayed) Labs Reviewed  SARS CORONAVIRUS 2 (TAT 6-24 HRS)    EKG   Radiology No results found.  Procedures Procedures (including critical care time)  Medications Ordered in UC Medications - No data to display  Initial Impression / Assessment and Plan / UC Course  I have reviewed the triage vital signs and the nursing notes.  Pertinent labs & imaging results that were available during my care of the patient were reviewed by me and considered in my medical decision making (see chart for details).     Final Clinical Impressions(s) / UC Diagnoses   Final diagnoses:  Viral upper respiratory tract infection     Discharge Instructions     Go home to rest Drink plenty of fluids Take Tylenol for pain or fever You may take over-the-counter cough and cold medicines as needed You must quarantine at home until your test result is available You can check for your test result in MyChart    ED Prescriptions    None     PDMP not reviewed this encounter.   Eustace Moore, MD 04/16/20 1438

## 2020-04-16 NOTE — Discharge Instructions (Signed)
Go home to rest Drink plenty of fluids Take Tylenol for pain or fever You may take over-the-counter cough and cold medicines as needed You must quarantine at home until your test result is available You can check for your test result in MyChart  

## 2020-04-16 NOTE — ED Triage Notes (Signed)
Patient presents to Urgent Care with complaints of runny nose since Tuesday. Patient reports has had some nasal bleeding and some dryness to throat. Denies fever. Pt wants to make sure he does not have covid as has a newborn in the house.

## 2020-06-09 ENCOUNTER — Other Ambulatory Visit: Payer: Self-pay

## 2020-06-09 ENCOUNTER — Ambulatory Visit: Payer: Self-pay

## 2020-06-09 ENCOUNTER — Encounter (HOSPITAL_COMMUNITY): Payer: Self-pay

## 2020-06-09 ENCOUNTER — Ambulatory Visit (HOSPITAL_COMMUNITY)
Admission: EM | Admit: 2020-06-09 | Discharge: 2020-06-09 | Disposition: A | Discharge status: Home / Self Care | Payer: HRSA Program | Attending: Emergency Medicine | Admitting: Emergency Medicine

## 2020-06-09 DIAGNOSIS — J069 Acute upper respiratory infection, unspecified: Secondary | ICD-10-CM

## 2020-06-09 DIAGNOSIS — U071 COVID-19: Secondary | ICD-10-CM | POA: Insufficient documentation

## 2020-06-09 DIAGNOSIS — Z791 Long term (current) use of non-steroidal anti-inflammatories (NSAID): Secondary | ICD-10-CM | POA: Insufficient documentation

## 2020-06-09 DIAGNOSIS — Z888 Allergy status to other drugs, medicaments and biological substances status: Secondary | ICD-10-CM | POA: Insufficient documentation

## 2020-06-09 DIAGNOSIS — R509 Fever, unspecified: Secondary | ICD-10-CM | POA: Diagnosis present

## 2020-06-09 LAB — RESP PANEL BY RT-PCR (FLU A&B, COVID) ARPGX2
Influenza A by PCR: NEGATIVE
Influenza B by PCR: NEGATIVE
SARS Coronavirus 2 by RT PCR: POSITIVE — AB

## 2020-06-09 NOTE — Discharge Instructions (Signed)
Push fluids to ensure adequate hydration and keep secretions thin.  Tylenol and/or ibuprofen as needed for pain or fevers.  Over the counter medications as needed for symptoms.  You may try some vitamins to help your immune system potentially:  Vitamin C 500mg  twice a day. Zinc 50mg  daily. Vitamin D 5000IU daily.   Self isolate until covid results are back and negative.  Will notify you by phone of any positive findings. Your negative results will be sent through your MyChart.     Return for any worsening of symptoms.

## 2020-06-09 NOTE — ED Provider Notes (Signed)
[facility]    CSN: [id number] Arrival date & time: 06/09/20  0272      History   Chief Complaint Chief Complaint  Patient presents with  . Nasal Congestion  . Fever    HPI Steve Stanley is a 34 y.o. male.   Steve Stanley presents with complaints of upper respiratory symptoms which started 4 days ago with congestion. This morning woke with temp of 100 and felt run down/ fatigued. Minimal cough. No shortness of breath . Mild headache. No gi symptoms. No shortness of breath . There have been some covid contacts at work, but he denies  Any close contact with these cases. He has not been vaccinated for covid-19.     ROS per HPI, negative if not otherwise mentioned.      Past Medical History:  Diagnosis Date  . Anxiety   . Atrial septal defect   . Depression   . Heart disease, unspecified   . Heartburn   . Mitral valve prolapse     There are no problems to display for this patient.   Past Surgical History:  Procedure Laterality Date  . CARDIAC SURGERY     PDA   . HERNIA REPAIR  05/01/2012   rih repair  . PATENT DUCTUS ARTERIOUS REPAIR         Home Medications    Prior to Admission medications   Medication Sig Start Date End Date Taking? Authorizing Provider  amoxicillin-clavulanate (AUGMENTIN) 875-125 MG tablet Take 1 tablet by mouth every 12 (twelve) hours. 12/03/18   Mardella Layman, MD  diclofenac (VOLTAREN) 75 MG EC tablet Take 1 tablet (75 mg total) by mouth 2 (two) times daily. 11/09/19   Mardella Layman, MD  diphenhydrAMINE-PE-APAP (ALLERGY RELIEF PLUS SINUS PO) Take by mouth. 2 tabs    [provider]  HYDROcodone-acetaminophen (NORCO/VICODIN) 5-325 MG tablet Take 1 tablet by mouth every 6 (six) hours as needed for moderate pain or severe pain. 11/09/19   Mardella Layman, MD  predniSONE (STERAPRED UNI-PAK 21 TAB) 10 MG (21) TBPK tablet Take by mouth daily. Take as directed. 11/09/19   Mardella Layman, MD  Pseudoeph-Doxylamine-DM-APAP  (NYQUIL PO) Take by mouth.    [provider]  silver sulfADIAZINE (SILVADENE) 1 % cream Apply 1 application topically daily. 12/03/18   Mardella Layman, MD    Family History Family History  Problem Relation Age of Onset  . Diabetes Mother   . Diabetes Father   . Hypertension Father   . Heart disease Father     Social History Social History   Tobacco Use  . Smoking status: Never Smoker  . Smokeless tobacco: Never Used  Substance Use Topics  . Alcohol use: Yes    Comment: DAILY   . Drug use: Yes    Types: Marijuana     Allergies   Benadryl [diphenhydramine]   Review of Systems Review of Systems   Physical Exam Triage Vital Signs ED Triage Vitals  Enc Vitals Group     BP 06/09/20 0908 (!) 147/93     Pulse Rate 06/09/20 0908 80     Resp 06/09/20 0908 17     Temp 06/09/20 0908 98.4 F (36.9 C)     Temp Source 06/09/20 0908 Oral     SpO2 06/09/20 0908 97 %     Weight --      Height --      Head Circumference --      Peak Flow --  Pain Score 06/09/20 0907 4     Pain Loc --      Pain Edu? --      Excl. in GC? --    No data found.  Updated Vital Signs BP (!) 147/93 (BP Location: Left Arm)   Pulse 80   Temp 98.4 F (36.9 C) (Oral)   Resp 17   SpO2 97%   Visual Acuity Right Eye Distance:   Left Eye Distance:   Bilateral Distance:    Right Eye Near:   Left Eye Near:    Bilateral Near:     Physical Exam Constitutional:      Appearance: He is well-developed.  HENT:     Nose: Rhinorrhea present.  Cardiovascular:     Rate and Rhythm: Normal rate.  Pulmonary:     Effort: Pulmonary effort is normal.  Skin:    General: Skin is warm and dry.  Neurological:     Mental Status: He is alert and oriented to person, place, and time.      UC Treatments / Results  Labs (all labs ordered are listed, but only abnormal results are displayed) Labs Reviewed  RESP PANEL BY RT-PCR (FLU A&B, COVID) [id number]    EKG   Radiology No results  found.  Procedures Procedures (including critical care time)  Medications Ordered in UC Medications - No data to display  Initial Impression / Assessment and Plan / UC Course  I have reviewed the triage vital signs and the nursing notes.  Pertinent labs & imaging results that were available during my care of the patient were reviewed by me and considered in my medical decision making (see chart for details).     Non toxic. Benign physical exam.  History and physical consistent with viral illness.  Covid testing pending and isolation instructions provided.  Supportive cares recommended. Return precautions provided. Patient verbalized understanding and agreeable to plan.   Final Clinical Impressions(s) / UC Diagnoses   Final diagnoses:  Upper respiratory tract infection, unspecified type     Discharge Instructions     Push fluids to ensure adequate hydration and keep secretions thin.  Tylenol and/or ibuprofen as needed for pain or fevers.  Over the counter medications as needed for symptoms.  You may try some vitamins to help your immune system potentially:  Vitamin C 500mg  twice a day. Zinc 50mg  daily. Vitamin D 5000IU daily.   Self isolate until covid results are back and negative.  Will notify you by phone of any positive findings. Your negative results will be sent through your MyChart.     Return for any worsening of symptoms.     ED Prescriptions    None     PDMP not reviewed this encounter.   , NP 06/09/20 [phone]

## 2020-06-09 NOTE — ED Triage Notes (Signed)
Pt in with c/o fever Tmax 100.1, headache, and congestion that has been going on for a few days.  Pt had cold and sinus medicine with some relief  Denies nausea, vomiting, diarrhea

## 2020-09-01 ENCOUNTER — Emergency Department
Admission: EM | Admit: 2020-09-01 | Discharge: 2020-09-01 | Disposition: A | Discharge status: Home / Self Care | Payer: Self-pay | Attending: Emergency Medicine | Admitting: Emergency Medicine

## 2020-09-01 ENCOUNTER — Encounter: Payer: Self-pay | Admitting: Emergency Medicine

## 2020-09-01 ENCOUNTER — Other Ambulatory Visit: Payer: Self-pay

## 2020-09-01 ENCOUNTER — Emergency Department: Payer: Self-pay

## 2020-09-01 DIAGNOSIS — N200 Calculus of kidney: Secondary | ICD-10-CM | POA: Insufficient documentation

## 2020-09-01 LAB — COMPREHENSIVE METABOLIC PANEL
ALT: 61 U/L — ABNORMAL HIGH (ref 0–44)
AST: 41 U/L (ref 15–41)
Albumin: 4.8 g/dL (ref 3.5–5.0)
Alkaline Phosphatase: 71 U/L (ref 38–126)
Anion gap: 9 (ref 5–15)
BUN: 10 mg/dL (ref 6–20)
CO2: 24 mmol/L (ref 22–32)
Calcium: 9.6 mg/dL (ref 8.9–10.3)
Chloride: 105 mmol/L (ref 98–111)
Creatinine, Ser: 1.02 mg/dL (ref 0.61–1.24)
GFR, Estimated: >60 mL/min (ref >60)
Glucose, Bld: 117 mg/dL — ABNORMAL HIGH (ref 70–99)
Potassium: 4.4 mmol/L (ref 3.5–5.1)
Sodium: 138 mmol/L (ref 135–145)
Total Bilirubin: 0.8 mg/dL (ref 0.3–1.2)
Total Protein: 7.4 g/dL (ref 6.5–8.1)

## 2020-09-01 LAB — URINALYSIS, COMPLETE (UACMP) WITH MICROSCOPIC
Bilirubin Urine: NEGATIVE
Glucose, UA: NEGATIVE mg/dL
Ketones, ur: NEGATIVE mg/dL
Leukocytes,Ua: NEGATIVE
Nitrite: NEGATIVE
Protein, ur: NEGATIVE mg/dL
Specific Gravity, Urine: 1.002 — ABNORMAL LOW (ref 1.005–1.030)
Squamous Epithelial / HPF: NONE SEEN (ref 0–5)
pH: 8 (ref 5.0–8.0)

## 2020-09-01 LAB — CBC
HCT: 46.9 % (ref 39.0–52.0)
Hemoglobin: 16.4 g/dL (ref 13.0–17.0)
MCH: 31 pg (ref 26.0–34.0)
MCHC: 35 g/dL (ref 30.0–36.0)
MCV: 88.7 fL (ref 80.0–100.0)
Platelets: 348 10*3/uL (ref 150–400)
RBC: 5.29 MIL/uL (ref 4.22–5.81)
RDW: 11.9 % (ref 11.5–15.5)
WBC: 8.2 10*3/uL (ref 4.0–10.5)
nRBC: 0 % (ref 0.0–0.2)

## 2020-09-01 LAB — LIPASE, BLOOD: Lipase: 27 U/L (ref 11–51)

## 2020-09-01 MED ORDER — MORPHINE SULFATE (PF) 4 MG/ML IV SOLN
4.0000 mg | Freq: Once | INTRAVENOUS | Status: AC
  Administered 2020-09-01: 4 mg via INTRAVENOUS
  Filled 2020-09-01: qty 1

## 2020-09-01 MED ORDER — ONDANSETRON 4 MG PO TBDP: 4.0000 mg | ORAL_TABLET | Freq: Three times a day (TID) | ORAL | 0 refills | Status: DC | PRN

## 2020-09-01 MED ORDER — KETOROLAC TROMETHAMINE 30 MG/ML IJ SOLN
30.0000 mg | Freq: Once | INTRAMUSCULAR | Status: AC
  Administered 2020-09-01: 30 mg via INTRAVENOUS
  Filled 2020-09-01: qty 1

## 2020-09-01 MED ORDER — OXYCODONE-ACETAMINOPHEN 5-325 MG PO TABS: 1.0000 | ORAL_TABLET | Freq: Three times a day (TID) | ORAL | 0 refills | Status: AC | PRN

## 2020-09-01 MED ORDER — TAMSULOSIN HCL 0.4 MG PO CAPS: 0.4000 mg | ORAL_CAPSULE | Freq: Every day | ORAL | 0 refills | Status: DC

## 2020-09-01 MED ORDER — ONDANSETRON HCL 4 MG/2ML IJ SOLN
4.0000 mg | Freq: Once | INTRAMUSCULAR | Status: AC
  Administered 2020-09-01: 4 mg via INTRAVENOUS
  Filled 2020-09-01: qty 2

## 2020-09-01 MED ORDER — NAPROXEN 500 MG PO TABS: 500.0000 mg | ORAL_TABLET | Freq: Two times a day (BID) | ORAL | 2 refills | Status: DC

## 2020-09-01 MED ORDER — IOHEXOL 300 MG/ML  SOLN
100.0000 mL | Freq: Once | INTRAMUSCULAR | Status: AC | PRN
  Administered 2020-09-01: 100 mL via INTRAVENOUS

## 2020-09-01 NOTE — ED Triage Notes (Signed)
Patient to ER for c/o LLQ abd pain with pain into "prostate". Patient also reports pain radiates into left flank. Patient reports vomiting x1. States pain was sudden onset at approx 0430 while driving to work. Patient has h/o kidney stones, but doesn't feel like it feels similar.

## 2020-09-01 NOTE — ED Notes (Signed)
ED Provider at bedside. 

## 2020-09-01 NOTE — ED Provider Notes (Signed)
[facility] Emergency Department Provider Note   ____________________________________________    I have reviewed the triage vital signs and the nursing notes.   HISTORY  Chief Complaint Abdominal Pain     HPI Steve Stanley is a 35 y.o. male who presents with complaints of left lower quadrant abdominal pain of relatively sudden onset this morning.  Reports the pain seems to radiate into his "prostate area ".  States he has had a kidney stone in the past but this feels different.  He denies dysuria.  No fever chills.  No history of abdominal surgery besides a hernia repair.  Normal stools.  Has not take anything for this, pain started this morning  Past Medical History:  Diagnosis Date  . Anxiety   . Atrial septal defect   . Depression   . Heart disease, unspecified   . Heartburn   . Mitral valve prolapse     There are no problems to display for this patient.   Past Surgical History:  Procedure Laterality Date  . CARDIAC SURGERY     PDA   . HERNIA REPAIR  05/01/2012   rih repair  . PATENT DUCTUS ARTERIOUS REPAIR      Prior to Admission medications   Medication Sig Start Date End Date Taking? Authorizing Provider  naproxen (NAPROSYN) 500 MG tablet Take 1 tablet (500 mg total) by mouth 2 (two) times daily with a meal. 09/01/20  Yes Jene Every, MD  ondansetron (ZOFRAN ODT) 4 MG disintegrating tablet Take 1 tablet (4 mg total) by mouth every 8 (eight) hours as needed. 09/01/20  Yes Jene Every, MD  oxyCODONE-acetaminophen (PERCOCET) 5-325 MG tablet Take 1 tablet by mouth every 8 (eight) hours as needed for up to 3 days for severe pain. 09/01/20 09/04/20 Yes Jene Every, MD  tamsulosin (FLOMAX) 0.4 MG CAPS capsule Take 1 capsule (0.4 mg total) by mouth daily. 09/01/20  Yes Jene Every, MD  amoxicillin-clavulanate (AUGMENTIN) 875-125 MG tablet Take 1 tablet by mouth every 12 (twelve) hours. 12/03/18   Mardella Layman, MD  diclofenac  (VOLTAREN) 75 MG EC tablet Take 1 tablet (75 mg total) by mouth 2 (two) times daily. 11/09/19   Mardella Layman, MD  diphenhydrAMINE-PE-APAP (ALLERGY RELIEF PLUS SINUS PO) Take by mouth. 2 tabs    [provider]  HYDROcodone-acetaminophen (NORCO/VICODIN) 5-325 MG tablet Take 1 tablet by mouth every 6 (six) hours as needed for moderate pain or severe pain. 11/09/19   Mardella Layman, MD  predniSONE (STERAPRED UNI-PAK 21 TAB) 10 MG (21) TBPK tablet Take by mouth daily. Take as directed. 11/09/19   Mardella Layman, MD  Pseudoeph-Doxylamine-DM-APAP (NYQUIL PO) Take by mouth.    [provider]  silver sulfADIAZINE (SILVADENE) 1 % cream Apply 1 application topically daily. 12/03/18   Mardella Layman, MD     Allergies Benadryl [diphenhydramine]  Family History  Problem Relation Age of Onset  . Diabetes Mother   . Diabetes Father   . Hypertension Father   . Heart disease Father     Social History Social History   Tobacco Use  . Smoking status: Never Smoker  . Smokeless tobacco: Never Used  Substance Use Topics  . Alcohol use: Yes    Comment: DAILY   . Drug use: Yes    Types: Marijuana    Review of Systems  Constitutional: No fever/chills Eyes: No visual changes.  ENT: No sore throat. Cardiovascular: Denies chest pain. Respiratory: Denies shortness of breath. Gastrointestinal: As above  Genitourinary: Negative for dysuria. Musculoskeletal: Negative for back pain. Skin: Negative for rash. Neurological: Negative for headaches    ____________________________________________   PHYSICAL EXAM:  VITAL SIGNS: ED Triage Vitals  Enc Vitals Group     BP 09/01/20 0752 (!) 159/99     Pulse Rate 09/01/20 0752 (!) 101     Resp 09/01/20 0752 18     Temp 09/01/20 0752 98.3 F (36.8 C)     Temp Source 09/01/20 0752 Oral     SpO2 09/01/20 0752 97 %     Weight 09/01/20 0754 56.7 kg (125 lb)     Height 09/01/20 0754 1.6 m (5\' 3" )     Head Circumference --      Peak Flow --       Pain Score 09/01/20 0803 10     Pain Loc --      Pain Edu? --      Excl. in GC? --     Constitutional: Alert and oriented  Nose: No congestion/rhinnorhea. Mouth/Throat: Mucous membranes are moist.   Neck:  Painless ROM Cardiovascular: Normal rate, regular rhythm. Grossly normal heart sounds.  Good peripheral circulation. Respiratory: Normal respiratory effort.  No retractions. Lungs CTAB. Gastrointestinal: Soft, mild tenderness left lower quadrant, no distention no CVA tenderness Genitourinary: deferred Musculoskeletal: Warm and well perfused Neurologic:  Normal speech and language. No gross focal neurologic deficits are appreciated.  Skin:  Skin is warm, dry and intact. No rash noted. Psychiatric: Mood and affect are normal. Speech and behavior are normal.  ____________________________________________   LABS (all labs ordered are listed, but only abnormal results are displayed)  Labs Reviewed  COMPREHENSIVE METABOLIC PANEL - Abnormal; Notable for the following components:      Result Value   Glucose, Bld 117 (*)    ALT 61 (*)    All other components within normal limits  URINALYSIS, COMPLETE (UACMP) WITH MICROSCOPIC - Abnormal; Notable for the following components:   Color, Urine STRAW (*)    APPearance CLEAR (*)    Specific Gravity, Urine 1.002 (*)    Hgb urine dipstick LARGE (*)    Bacteria, UA RARE (*)    All other components within normal limits  LIPASE, BLOOD  CBC   ____________________________________________  EKG   ____________________________________________  RADIOLOGY  CT abdomen pelvis reviewed by me ____________________________________________   PROCEDURES  Procedure(s) performed: No  Procedures   Critical Care performed: No ____________________________________________   INITIAL IMPRESSION / ASSESSMENT AND PLAN / ED COURSE  Pertinent labs & imaging results that were available during my care of the patient were reviewed by me and  considered in my medical decision making (see chart for details).  Patient presents with left lower quadrant abdominal pain as detailed above, relatively sudden onset suspicious for ureterolithiasis, differential also includes diverticulitis, less likely prostatitis, UTI  We will give IV morphine, IV Zofran for pain, obtain labs urinalysis and CT abdomen pelvis and reevaluate.  Patient feeling improved after morphine but still having some pain, will give IV Toradol as CT demonstrates left-sided 6 x 3 mm kidney stone  Patient much better after Toradol.  Pain is controlled, no evidence of infection, appropriate for outpatient follow-up, return precautions discussed, may need intervention given size of stone    ____________________________________________   FINAL CLINICAL IMPRESSION(S) / ED DIAGNOSES  Final diagnoses:  Kidney stone        Note:  This document was prepared using Dragon voice recognition software and may include unintentional dictation errors.  Jene Every, MD 09/01/20 1014

## 2020-09-01 NOTE — ED Notes (Signed)
Pt returned from radiology.

## 2020-09-01 NOTE — ED Notes (Signed)
Pt transported to CT ?

## 2021-07-25 ENCOUNTER — Ambulatory Visit (HOSPITAL_COMMUNITY)
Admission: EM | Admit: 2021-07-25 | Discharge: 2021-07-25 | Disposition: A | Discharge status: Home / Self Care | Payer: 59 | Attending: Emergency Medicine | Admitting: Emergency Medicine

## 2021-07-25 ENCOUNTER — Encounter (HOSPITAL_COMMUNITY): Payer: Self-pay

## 2021-07-25 ENCOUNTER — Other Ambulatory Visit: Payer: Self-pay

## 2021-07-25 DIAGNOSIS — M7711 Lateral epicondylitis, right elbow: Secondary | ICD-10-CM | POA: Diagnosis not present

## 2021-07-25 NOTE — ED Provider Notes (Addendum)
[facility]    CSN: [id number] Arrival date & time: 07/25/21  1329      History   Chief Complaint Chief Complaint  Patient presents with   Arm Pain    Right    HPI Steve Stanley is a 36 y.o. male. He reports R elbow pain intermittently for awhile, worse/persistent in the last week. 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 PAin is in R elbow and forearm and is worse after working. Occasionally he will feel a sharp, zinging pain up to his neck from his R elbow; he denies neck pain or injury.    Arm Pain   Past Medical History:  Diagnosis Date   Anxiety    Atrial septal defect    Depression    Heart disease, unspecified    Heartburn    Mitral valve prolapse     There are no problems to display for this patient.   Past Surgical History:  Procedure Laterality Date   CARDIAC SURGERY     PDA    HERNIA REPAIR  05/01/2012   rih repair   PATENT DUCTUS ARTERIOUS REPAIR         Home Medications    Prior to Admission medications   Medication Sig Start Date End Date Taking? Authorizing Provider  amoxicillin-clavulanate (AUGMENTIN) 875-125 MG tablet Take 1 tablet by mouth every 12 (twelve) hours. 12/03/18   Mardella Layman, MD  diclofenac (VOLTAREN) 75 MG EC tablet Take 1 tablet (75 mg total) by mouth 2 (two) times daily. 11/09/19   Mardella Layman, MD  diphenhydrAMINE-PE-APAP (ALLERGY RELIEF PLUS SINUS PO) Take by mouth. 2 tabs    [provider]  HYDROcodone-acetaminophen (NORCO/VICODIN) 5-325 MG tablet Take 1 tablet by mouth every 6 (six) hours as needed for moderate pain or severe pain. 11/09/19   Mardella Layman, MD  naproxen (NAPROSYN) 500 MG tablet Take 1 tablet (500 mg total) by mouth 2 (two) times daily with a meal. 09/01/20   Jene Every, MD  ondansetron (ZOFRAN ODT) 4 MG disintegrating tablet Take 1 tablet (4 mg total) by mouth every 8 (eight) hours as needed. 09/01/20   Jene Every, MD  predniSONE  (STERAPRED UNI-PAK 21 TAB) 10 MG (21) TBPK tablet Take by mouth daily. Take as directed. 11/09/19   Mardella Layman, MD  Pseudoeph-Doxylamine-DM-APAP (NYQUIL PO) Take by mouth.    [provider]  silver sulfADIAZINE (SILVADENE) 1 % cream Apply 1 application topically daily. 12/03/18   Mardella Layman, MD  tamsulosin (FLOMAX) 0.4 MG CAPS capsule Take 1 capsule (0.4 mg total) by mouth daily. 09/01/20   Jene Every, MD    Family History Family History  Problem Relation Age of Onset   Diabetes Mother    Diabetes Father    Hypertension Father    Heart disease Father     Social History Social History   Tobacco Use   Smoking status: Never   Smokeless tobacco: Never  Substance Use Topics   Alcohol use: Yes    Comment: DAILY    Drug use: Yes    Types: Marijuana     Allergies   Benadryl [diphenhydramine]   Review of Systems Review of Systems   Physical Exam Triage Vital Signs ED Triage Vitals  Enc Vitals Group     BP 07/25/21 1354 (!) 140/100     Pulse Rate 07/25/21 1354 74     Resp 07/25/21 1354 17  Temp --      Temp src --      SpO2 07/25/21 1354 96 %     Weight --      Height --      Head Circumference --      Peak Flow --      Pain Score 07/25/21 1352 8     Pain Loc --      Pain Edu? --      Excl. in GC? --    No data found.  Updated Vital Signs BP (!) 140/100 (BP Location: Right Arm)    Pulse 74    Resp 17    SpO2 96%   Visual Acuity Right Eye Distance:   Left Eye Distance:   Bilateral Distance:    Right Eye Near:   Left Eye Near:    Bilateral Near:     Physical Exam Pulmonary:     Effort: Pulmonary effort is normal.  Musculoskeletal:     Right elbow: No deformity. Normal range of motion. Tenderness present in medial epicondyle and lateral epicondyle.     Comments: Both medial and lateral R epicondyle tender to palpation; forearm mildly tender to palp. Pain increases with ROM of elbow and wrist. Symptoms improved slightly with application  of armband to below lateral epicondyle.   Neurological:     Mental Status: He is alert.      UC Treatments / Results  Labs (all labs ordered are listed, but only abnormal results are displayed) Labs Reviewed - No data to display  EKG   Radiology No results found.  Procedures Procedures (including critical care time)  Medications Ordered in UC Medications - No data to display  Initial Impression / Assessment and Plan / UC Course  I have reviewed the triage vital signs and the nursing notes.  Pertinent labs & imaging results that were available during my care of the patient were reviewed by me and considered in my medical decision making (see chart for details).    Armband placed on R forearm. Referred to sports medicine. Given note for work.   Final Clinical Impressions(s) / UC Diagnoses   Final diagnoses:  Lateral epicondylitis of right elbow     Discharge Instructions      Call sports medicine today to get an appointment. Use the arm band brace to support your arm.   You can take either ibuprofen 800mg  three times a day or you can take ibuprofen 600mg  four times a day. I advise you to take it with food.    ED Prescriptions   None    PDMP not reviewed this encounter.   , NP 07/25/21 1457    Cathlyn Parsons, NP 07/26/21 [phone]

## 2021-07-25 NOTE — Discharge Instructions (Addendum)
Call sports medicine today to get an appointment. Use the arm band brace to support your arm.   You can take either ibuprofen 800mg  three times a day or you can take ibuprofen 600mg  four times a day. I advise you to take it with food.

## 2021-07-25 NOTE — ED Triage Notes (Signed)
Pt presents with c/o R arm pain that starts at the top of the fore arm and moves to the neck. States the pain is sharp.   States holding anything on the R hand is painful.

## 2021-07-30 NOTE — Progress Notes (Signed)
Subjective:    CC: R elbow and forearm pain  I, Molly Weber, LAT, ATC, am serving as scribe for Dr. Clementeen Graham.  HPI: Pt is a 36 y/o RHD male presenting w/ c/o R elbow and forearm pain worsening x one month.  Pt was seen at the [facility] on 07/25/21.  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  He locates his pain to .  R elbow swelling: yes at the R anterior to lateral epicondyle R elbow mechanical symptoms: yes, his elbow pops and sometimes get stuck R UE numbness/tingling: yes in his Right palmar hand and radial fingers to  R volar forearm down to his thumb w/ a sensation of warmth Neck pain: no Aggravating factors: nothing in particular; constant pain Treatments tried: IBU; counterforce strap; compression; Epsom salts; IcyHot; sling  Pertinent review of Systems: No fevers or chills.  Relevant historical information: Otherwise healthy   Objective:    Vitals:   07/31/21 0800  BP: 120/86  Pulse: 76  SpO2: 96%   General: Well Developed, well nourished, and in no acute distress.   MSK: C-spine: Normal. Nontender midline. Normal cervical motion. Negative Spurling's test. Reflexes are intact and equal bilateral upper extremities. Strength diminished right triceps extension and elbow flexion right 4+/5 compared to left  Right grip strength decreased 4/5 compared to left. Otherwise strength upper extremities equal bilaterally and intact.  Right elbow normal-appearing Normal motion. Nontender at the lateral epicondyle. Tender palpation at anterior elbow at biceps tendon distally and brachial radialis anterior lateral elbow. Stable ligamentous exam elbow.   Lab and Radiology Results  Diagnostic Limited MSK Ultrasound of: Right wrist and Elbow.  Right Carpal Tunnel.  Median Nerve in Carpal Tunnel enlarged at 12.5 mm cross-sectional area (typical for mild carpal tunnel syndrome).  Flexor tendons and carpal tunnel  normal-appearing Lateral epicondyle normal-appearing without evidence of tear or avulsion at common stents or tendon origin. Impression: Carpal tunnel syndrome right wrist.  No evidence of lateral epicondylitis right lateral elbow  X-ray images right elbow and C-spine obtained today personally and independently interpreted  C-spine: Normal-appearing cervical spine without evidence of significant DDD.  No fractures or malalignment.  Right elbow: No fractures.  No significant degenerative changes.  No apparent loose body.  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 I think he has several different issues that are contributing to his pain.  I think his paresthesias into his hand and radiating into his forearm are very likely to be carpal tunnel syndrome.  He notes paresthesias and pain after using a vibrating grinder and has paresthesias with persistent wrist flexion activities such as using a TIG welder or driving or at night. His median nerve is mildly enlarged on ultrasound which is also characteristic for carpal tunnel syndrome.  We discussed treatment plan and options.  He declined an injection but did accept prednisone and gabapentin at bedtime.  Recommend wrist brace at bedtime as well.  Additionally I think his pain is related to distal biceps tendinitis.  He should benefit from hand physical therapy referral.  Additionally discussed some changes that he can do ergonomically at work.  Recheck in about a month.  Return sooner if needed.  PDMP not reviewed this encounter. Orders Placed This Encounter  Procedures   [place] LIMITED JOINT SPACE STRUCTURES UP RIGHT(NO LINKED  CHARGES)    Order Specific Question:   Reason for Exam (SYMPTOM  OR DIAGNOSIS REQUIRED)    Answer:   R elbow pain    Order Specific Question:   Preferred imaging location?    Answer:    [facility] Cervical Spine 2 or 3 views    Standing Status:   Future    Number of Occurrences:   1    Standing Expiration Date:   08/28/2021    Order Specific Question:   Reason for Exam (SYMPTOM  OR DIAGNOSIS REQUIRED)    Answer:   R arm pain    Order Specific Question:   Preferred imaging location?    Answer:   [facility]   DG Elbow 2 Views Right    Standing Status:   Future    Number of Occurrences:   1    Standing Expiration Date:   08/28/2021    Order Specific Question:   Reason for Exam (SYMPTOM  OR DIAGNOSIS REQUIRED)    Answer:   R elbow pain    Order Specific Question:   Preferred imaging location?    Answer:   [facility]   Ambulatory referral to Physical Therapy    Referral Priority:   Routine    Referral Type:   Physical Medicine    Referral Reason:   Specialty Services Required    Requested Specialty:   Physical Therapy    Number of Visits Requested:   1   Meds ordered this encounter  Medications   predniSONE (DELTASONE) 50 MG tablet    Sig: Take 1 pill daily for 5 days    Dispense:  5 tablet    Refill:  0   gabapentin (NEURONTIN) 300 MG capsule    Sig: Take 1 capsule (300 mg total) by mouth at bedtime.    Dispense:  30 capsule    Refill:  2    Discussed warning signs or symptoms. Please see discharge instructions. Patient expresses understanding.   The above documentation has been reviewed and is accurate and complete Clementeen Graham, M.D.

## 2021-07-31 ENCOUNTER — Other Ambulatory Visit: Payer: Self-pay

## 2021-07-31 ENCOUNTER — Encounter: Payer: Self-pay | Admitting: Family Medicine

## 2021-07-31 ENCOUNTER — Ambulatory Visit: Payer: 59 | Admitting: Family Medicine

## 2021-07-31 ENCOUNTER — Ambulatory Visit (INDEPENDENT_AMBULATORY_CARE_PROVIDER_SITE_OTHER): Payer: 59

## 2021-07-31 ENCOUNTER — Ambulatory Visit: Payer: Self-pay

## 2021-07-31 VITALS — BP 120/86 | HR 76 | Ht 63.0 in | Wt 143.4 lb

## 2021-07-31 DIAGNOSIS — M79601 Pain in right arm: Secondary | ICD-10-CM

## 2021-07-31 DIAGNOSIS — M25521 Pain in right elbow: Secondary | ICD-10-CM

## 2021-07-31 DIAGNOSIS — G5601 Carpal tunnel syndrome, right upper limb: Secondary | ICD-10-CM | POA: Diagnosis not present

## 2021-07-31 DIAGNOSIS — M7521 Bicipital tendinitis, right shoulder: Secondary | ICD-10-CM | POA: Diagnosis not present

## 2021-07-31 MED ORDER — PREDNISONE 50 MG PO TABS: ORAL_TABLET | ORAL | 0 refills | Status: AC

## 2021-07-31 MED ORDER — GABAPENTIN 300 MG PO CAPS
300.0000 mg | ORAL_CAPSULE | Freq: Every day | ORAL | 2 refills | Status: AC
Start: 2021-07-31 — End: ?

## 2021-07-31 NOTE — Patient Instructions (Addendum)
Nice to meet you today.  Please get an Xray today before you leave.  I've referred you to Physical Therapy.  Let us know if you don't hear from them in one week.   I've prescribe you Gabapentin to take at bedtime and a 5 day course of prednisone  Wrist brace at night  Follow-up: 6-8 weeks

## 2021-08-01 NOTE — Progress Notes (Signed)
X-ray cervical spine shows no fractures.  Normal looking x-ray.

## 2021-08-01 NOTE — Progress Notes (Signed)
Right elbow x-ray is normal to radiology.

## 2021-09-11 ENCOUNTER — Ambulatory Visit: Payer: 59 | Admitting: Family Medicine

## 2021-09-11 NOTE — Progress Notes (Deleted)
? ?  I, Christoper Fabian, LAT, ATC, am serving as scribe for Dr. Clementeen Graham. ? ?[place] A Early is a 36 y.o. male who presents to [facility] today for f/u of R forearm and wrist/hand pain and paresthesias likely due to a combination of issues including distal biceps tendinitis and carpal tunnel syndrome.  He was last seen by Dr. Denyse Amass on 07/31/21 and was prescribed Gabapentin and a short course of prednisone.  He was also referred to PT to [facility] and was advised to wear a wrist brace at night.  Today, pt reports ? ?Diagnostic testing: R elbow and C-spine XR- 07/31/21 ? ?Pertinent review of systems: *** ? ?Relevant historical information: *** ? ? ?Exam:  ?There were no vitals taken for this visit. ?General: Well Developed, well nourished, and in no acute distress.  ? ?MSK: *** ? ? ? ?Lab and Radiology Results ?No results found for this or any previous visit (from the past 72 hour(s)). ?No results found. ? ? ? ? ?Assessment and Plan: ?36 y.o. male with *** ? ? ?PDMP not reviewed this encounter. ?No orders of the defined types were placed in this encounter. ? ?No orders of the defined types were placed in this encounter. ? ? ? ?Discussed warning signs or symptoms. Please see discharge instructions. Patient expresses understanding. ? ? ?*** ? ?

## 2021-09-12 ENCOUNTER — Ambulatory Visit: Payer: 59 | Admitting: Family Medicine

## 2023-04-19 IMAGING — DX DG CERVICAL SPINE 2 OR 3 VIEWS
3 series · 3 of 3 positions shown · non-contrast
Comparison: CT scan cervical spine 01/21/2018

CLINICAL DATA: Right upper extremity pain.  No known injury.

EXAM:
CERVICAL SPINE - 2-3 VIEW

[c-spine lat]
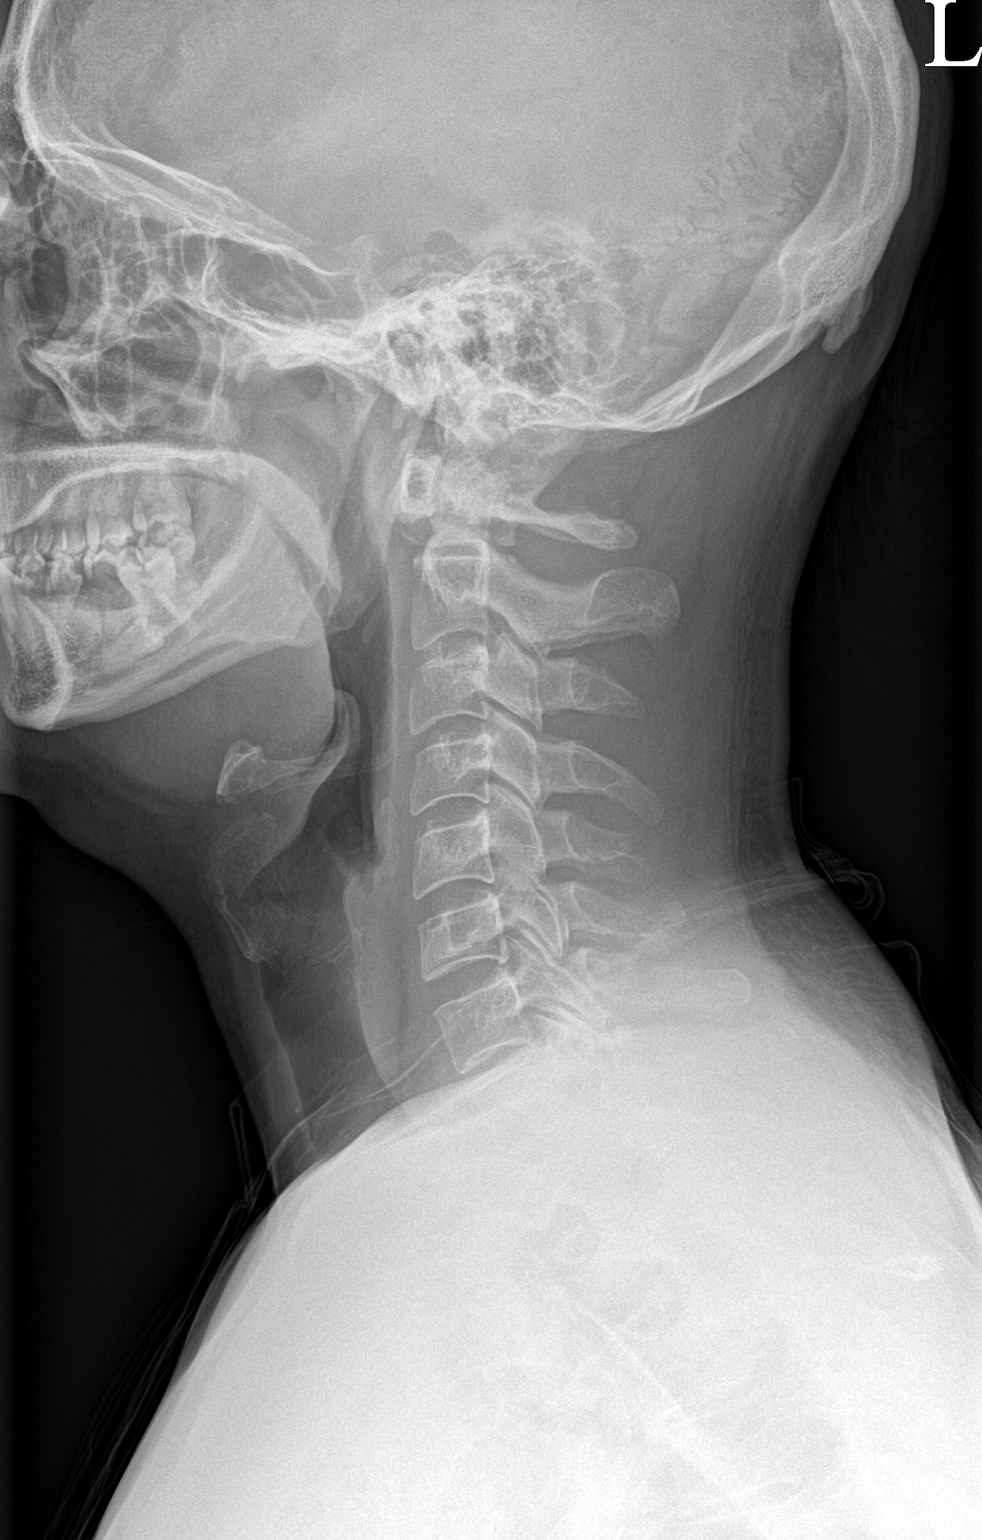

[c-spine ap]
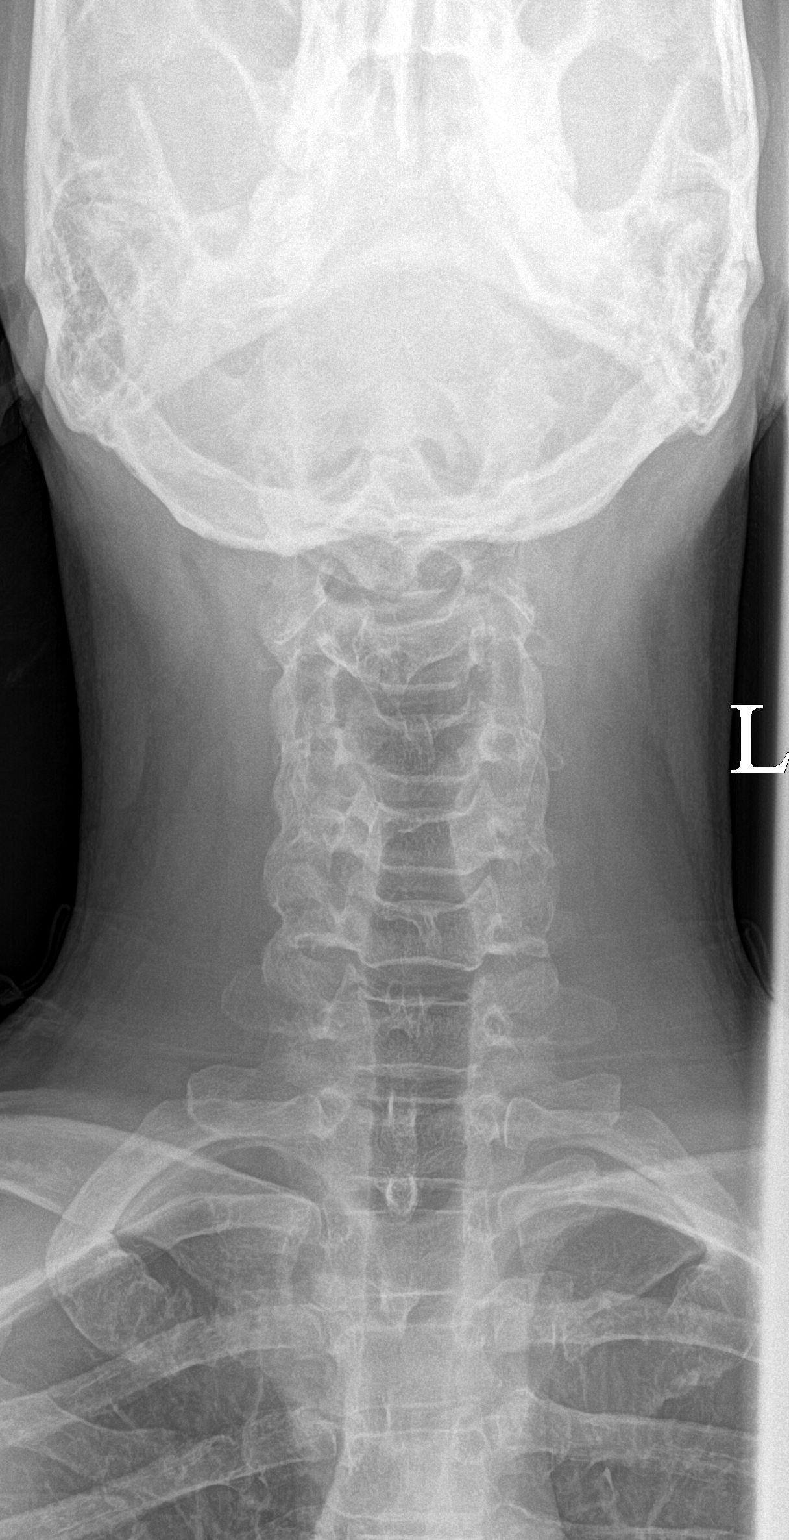

[c-spine open mouth]
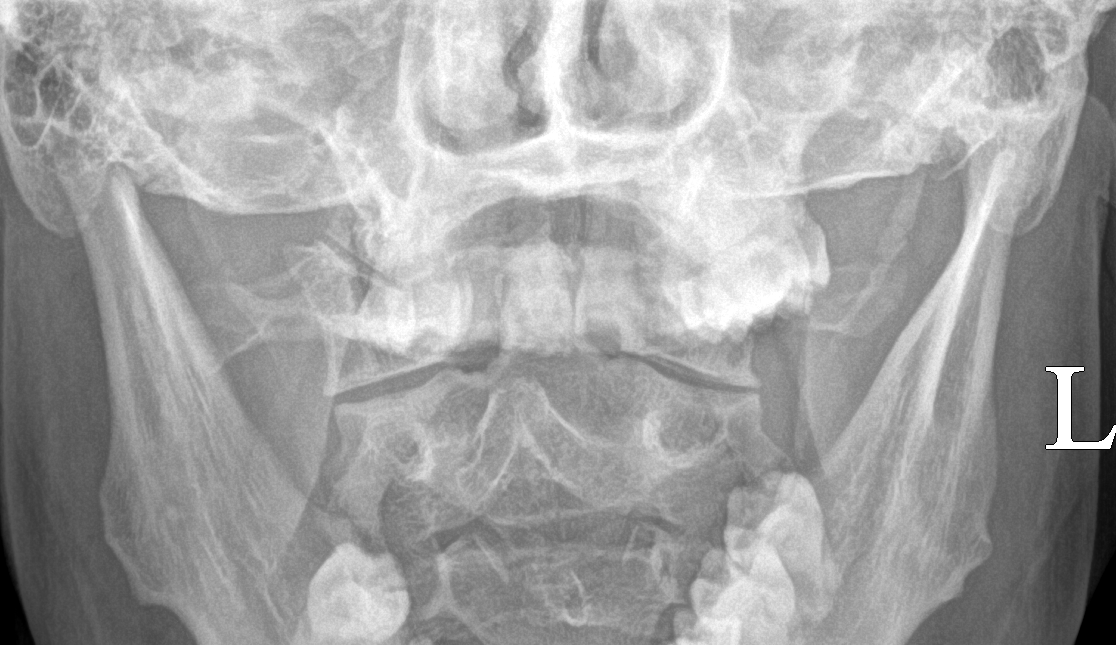

[3 of 3 positions shown; findings below may reference images not displayed]

FINDINGS: There is no evidence of cervical spine fracture or prevertebral soft
tissue swelling. Alignment is normal. No other significant bone
abnormalities are identified. There is preservation of the normal
vertebral body and disc heights at all levels. Arthritic changes are
not seen.
IMPRESSION: No acute radiographic findings with AP-lateral and odontoid cervical
views.

## 2023-08-16 ENCOUNTER — Emergency Department (HOSPITAL_COMMUNITY): Payer: BC Managed Care – PPO

## 2023-08-16 ENCOUNTER — Emergency Department (HOSPITAL_COMMUNITY)
Admission: EM | Admit: 2023-08-16 | Discharge: 2023-08-16 | Disposition: A | Discharge status: Home / Self Care | Payer: BC Managed Care – PPO | Attending: Emergency Medicine | Admitting: Emergency Medicine

## 2023-08-16 ENCOUNTER — Encounter (HOSPITAL_COMMUNITY): Payer: Self-pay | Admitting: *Deleted

## 2023-08-16 ENCOUNTER — Encounter (HOSPITAL_COMMUNITY): Payer: Self-pay | Admitting: Emergency Medicine

## 2023-08-16 ENCOUNTER — Other Ambulatory Visit: Payer: Self-pay

## 2023-08-16 ENCOUNTER — Ambulatory Visit (HOSPITAL_COMMUNITY)
Admission: EM | Admit: 2023-08-16 | Discharge: 2023-08-16 | Disposition: A | Discharge status: Home / Self Care | Payer: BC Managed Care – PPO | Attending: Physician Assistant | Admitting: Physician Assistant

## 2023-08-16 DIAGNOSIS — R197 Diarrhea, unspecified: Secondary | ICD-10-CM

## 2023-08-16 DIAGNOSIS — K529 Noninfective gastroenteritis and colitis, unspecified: Secondary | ICD-10-CM | POA: Diagnosis not present

## 2023-08-16 DIAGNOSIS — R112 Nausea with vomiting, unspecified: Secondary | ICD-10-CM

## 2023-08-16 LAB — CBC
HCT: 49.7 % (ref 39.0–52.0)
Hemoglobin: 17.8 g/dL — ABNORMAL HIGH (ref 13.0–17.0)
MCH: 31.8 pg (ref 26.0–34.0)
MCHC: 35.8 g/dL (ref 30.0–36.0)
MCV: 88.8 fL (ref 80.0–100.0)
Platelets: 313 10*3/uL (ref 150–400)
RBC: 5.6 MIL/uL (ref 4.22–5.81)
RDW: 11.8 % (ref 11.5–15.5)
WBC: 15.7 10*3/uL — ABNORMAL HIGH (ref 4.0–10.5)
nRBC: 0 % (ref 0.0–0.2)

## 2023-08-16 LAB — BASIC METABOLIC PANEL
Anion gap: 11 (ref 5–15)
BUN: 18 mg/dL (ref 6–20)
CO2: 19 mmol/L — ABNORMAL LOW (ref 22–32)
Calcium: 8.8 mg/dL — ABNORMAL LOW (ref 8.9–10.3)
Chloride: 108 mmol/L (ref 98–111)
Creatinine, Ser: 1.34 mg/dL — ABNORMAL HIGH (ref 0.61–1.24)
GFR, Estimated: >60 mL/min (ref >60)
Glucose, Bld: 112 mg/dL — ABNORMAL HIGH (ref 70–99)
Potassium: 3.7 mmol/L (ref 3.5–5.1)
Sodium: 138 mmol/L (ref 135–145)

## 2023-08-16 LAB — COMPREHENSIVE METABOLIC PANEL
ALT: 85 U/L — ABNORMAL HIGH (ref 0–44)
AST: 53 U/L — ABNORMAL HIGH (ref 15–41)
Albumin: 4.9 g/dL (ref 3.5–5.0)
Alkaline Phosphatase: 56 U/L (ref 38–126)
Anion gap: 15 (ref 5–15)
BUN: 20 mg/dL (ref 6–20)
CO2: 19 mmol/L — ABNORMAL LOW (ref 22–32)
Calcium: 10.5 mg/dL — ABNORMAL HIGH (ref 8.9–10.3)
Chloride: 102 mmol/L (ref 98–111)
Creatinine, Ser: 1.46 mg/dL — ABNORMAL HIGH (ref 0.61–1.24)
GFR, Estimated: >60 mL/min (ref >60)
Glucose, Bld: 146 mg/dL — ABNORMAL HIGH (ref 70–99)
Potassium: 5.2 mmol/L — ABNORMAL HIGH (ref 3.5–5.1)
Sodium: 136 mmol/L (ref 135–145)
Total Bilirubin: 2 mg/dL — ABNORMAL HIGH (ref 0.0–1.2)
Total Protein: 7.7 g/dL (ref 6.5–8.1)

## 2023-08-16 LAB — URINALYSIS, ROUTINE W REFLEX MICROSCOPIC
Bilirubin Urine: NEGATIVE
Glucose, UA: NEGATIVE mg/dL
Hgb urine dipstick: NEGATIVE
Ketones, ur: NEGATIVE mg/dL
Leukocytes,Ua: NEGATIVE
Nitrite: NEGATIVE
Protein, ur: NEGATIVE mg/dL
Specific Gravity, Urine: >1.046 — ABNORMAL HIGH (ref 1.005–1.030)
pH: 5 (ref 5.0–8.0)

## 2023-08-16 LAB — I-STAT CHEM 8, ED
BUN: 22 mg/dL — ABNORMAL HIGH (ref 6–20)
Calcium, Ion: 1.12 mmol/L — ABNORMAL LOW (ref 1.15–1.40)
Chloride: 106 mmol/L (ref 98–111)
Creatinine, Ser: 1.4 mg/dL — ABNORMAL HIGH (ref 0.61–1.24)
Glucose, Bld: 148 mg/dL — ABNORMAL HIGH (ref 70–99)
HCT: 52 % (ref 39.0–52.0)
Hemoglobin: 17.7 g/dL — ABNORMAL HIGH (ref 13.0–17.0)
Potassium: 5.1 mmol/L (ref 3.5–5.1)
Sodium: 137 mmol/L (ref 135–145)
TCO2: 20 mmol/L — ABNORMAL LOW (ref 22–32)

## 2023-08-16 LAB — LIPASE, BLOOD: Lipase: 22 U/L (ref 11–51)

## 2023-08-16 LAB — POC COVID19/FLU A&B COMBO
Covid Antigen, POC: NEGATIVE
Influenza A Antigen, POC: NEGATIVE
Influenza B Antigen, POC: NEGATIVE

## 2023-08-16 MED ORDER — ONDANSETRON HCL 4 MG/2ML IJ SOLN
INTRAMUSCULAR | Status: AC
Start: 2023-08-16 — End: ?
  Filled 2023-08-16: qty 2

## 2023-08-16 MED ORDER — ONDANSETRON 4 MG PO TBDP: 4.0000 mg | ORAL_TABLET | Freq: Three times a day (TID) | ORAL | 0 refills | Status: AC | PRN

## 2023-08-16 MED ORDER — ONDANSETRON 4 MG PO TBDP
ORAL_TABLET | ORAL | Status: AC
Start: 2023-08-16 — End: ?
  Filled 2023-08-16: qty 1

## 2023-08-16 MED ORDER — SODIUM CHLORIDE 0.9 % IV BOLUS
1000.0000 mL | Freq: Once | INTRAVENOUS | Status: AC
  Administered 2023-08-16: 1000 mL via INTRAVENOUS

## 2023-08-16 MED ORDER — SODIUM CHLORIDE 0.9 % IV SOLN
12.5000 mg | Freq: Four times a day (QID) | INTRAVENOUS | Status: DC | PRN
  Administered 2023-08-16: 12.5 mg via INTRAVENOUS
  Filled 2023-08-16: qty 12.5

## 2023-08-16 MED ORDER — IOHEXOL 350 MG/ML SOLN
75.0000 mL | Freq: Once | INTRAVENOUS | Status: AC | PRN
  Administered 2023-08-16: 75 mL via INTRAVENOUS

## 2023-08-16 MED ORDER — ONDANSETRON 4 MG PO TBDP: 4.0000 mg | ORAL_TABLET | Freq: Once | ORAL | Status: DC

## 2023-08-16 MED ORDER — ONDANSETRON HCL 4 MG/2ML IJ SOLN
4.0000 mg | Freq: Once | INTRAMUSCULAR | Status: AC
  Administered 2023-08-16: 4 mg via INTRAMUSCULAR

## 2023-08-16 NOTE — ED Notes (Signed)
 Pt given a cup of ice water for fluid challenge, pt did state that the injection of zofran has helped his nausea

## 2023-08-16 NOTE — Discharge Instructions (Signed)
 You were seen today for abdominal pain, vomiting, diarrhea.  Your CT scan and lab work are consistent with likely a stomach bug called enterocolitis.  This should resolve on its own but you need to drink plain fluids.  You can take the prescribed nausea medication as well as Tylenol and Motrin for pain.  Your liver enzymes were slightly elevated and your liver shows evidence of hepatic steatosis, you need to follow-up with a doctor about this.  If you develop severe pain, blood in your stool, inability eat or drink or any other new concerning symptoms you should return to the ED.

## 2023-08-16 NOTE — ED Notes (Signed)
 Pt drinking water and ginger ale provided. Pt declined covid swab as he had one done this morning and was negative for rsv, flu and covid. Pt has results with him.

## 2023-08-16 NOTE — Discharge Instructions (Addendum)
 Flu A, flu B and COVID are negative.  Unfortunately, there is intractable nausea and vomiting with diarrhea.  This has continued despite IM Zofran.  At this point recommend further evaluation at the emergency room.

## 2023-08-16 NOTE — ED Provider Notes (Signed)
 [place] EMERGENCY DEPARTMENT AT [facility] Provider Note   CSN: [id number] Arrival date & time: 08/16/23  1440     History  Chief Complaint  Patient presents with   Emesis    Steve Stanley is a 38 y.o. male.   Emesis 38 year old male history of anxiety, depression, mitral valve prolapse presenting for vomiting, diarrhea, abdominal pain.  Patient states he woke up today with nausea, vomiting, nonbloody diarrhea.  He states the pain is lower abdomen.  Family with similar illness.  No fevers or chills.  No chest pain or shortness of breath or syncope.  He does report some mild dysuria.  No testicular pain.     Home Medications Prior to Admission medications   Medication Sig Start Date End Date Taking? Authorizing Provider  ondansetron (ZOFRAN-ODT) 4 MG disintegrating tablet Take 1 tablet (4 mg total) by mouth every 8 (eight) hours as needed for nausea or vomiting. 08/16/23  Yes Laurence Spates, MD  gabapentin (NEURONTIN) 300 MG capsule Take 1 capsule (300 mg total) by mouth at bedtime. 07/31/21   Rodolph Bong, MD  predniSONE (DELTASONE) 50 MG tablet Take 1 pill daily for 5 days 07/31/21   Rodolph Bong, MD      Allergies    Benadryl [diphenhydramine]    Review of Systems   Review of Systems  Gastrointestinal:  Positive for vomiting.  Review of systems completed and notable as per HPI.  ROS otherwise negative.   Physical Exam Updated Vital Signs BP 116/67 (BP Location: Right Arm)   Pulse 92   Temp 98.5 F (36.9 C) (Oral)   Resp 16   Wt 59 kg   SpO2 100%   BMI 23.03 kg/m  Physical Exam Vitals and nursing note reviewed.  Constitutional:      General: He is not in acute distress.    Appearance: He is well-developed.  HENT:     Head: Normocephalic and atraumatic.     Mouth/Throat:     Mouth: Mucous membranes are moist.     Pharynx: Oropharynx is clear.  Eyes:     Extraocular Movements: Extraocular movements intact.     Conjunctiva/sclera:  Conjunctivae normal.     Pupils: Pupils are equal, round, and reactive to light.  Cardiovascular:     Rate and Rhythm: Normal rate and regular rhythm.     Pulses: Normal pulses.     Heart sounds: Normal heart sounds. No murmur heard. Pulmonary:     Effort: Pulmonary effort is normal. No respiratory distress.     Breath sounds: Normal breath sounds.  Abdominal:     Palpations: Abdomen is soft.     Tenderness: There is abdominal tenderness. There is no guarding or rebound.     Comments: Mild diffuse tenderness  Musculoskeletal:        General: No swelling.     Cervical back: Neck supple.     Right lower leg: No edema.     Left lower leg: No edema.  Skin:    General: Skin is warm and dry.     Capillary Refill: Capillary refill takes less than 2 seconds.  Neurological:     General: No focal deficit present.     Mental Status: He is alert and oriented to person, place, and time. Mental status is at baseline.  Psychiatric:        Mood and Affect: Mood normal.     ED Results / Procedures / Treatments   Labs (all  labs ordered are listed, but only abnormal results are displayed) Labs Reviewed  COMPREHENSIVE METABOLIC PANEL - Abnormal; Notable for the following components:      Result Value   Potassium 5.2 (*)    CO2 19 (*)    Glucose, Bld 146 (*)    Creatinine, Ser 1.46 (*)    Calcium 10.5 (*)    AST 53 (*)    ALT 85 (*)    Total Bilirubin 2.0 (*)    All other components within normal limits  CBC - Abnormal; Notable for the following components:   WBC 15.7 (*)    Hemoglobin 17.8 (*)    All other components within normal limits  URINALYSIS, ROUTINE W REFLEX MICROSCOPIC - Abnormal; Notable for the following components:   Specific Gravity, Urine >1.046 (*)    All other components within normal limits  BASIC METABOLIC PANEL - Abnormal; Notable for the following components:   CO2 19 (*)    Glucose, Bld 112 (*)    Creatinine, Ser 1.34 (*)    Calcium 8.8 (*)    All other  components within normal limits  I-STAT CHEM 8, ED - Abnormal; Notable for the following components:   BUN 22 (*)    Creatinine, Ser 1.40 (*)    Glucose, Bld 148 (*)    Calcium, Ion 1.12 (*)    TCO2 20 (*)    Hemoglobin 17.7 (*)    All other components within normal limits  RESP PANEL BY RT-PCR (RSV, FLU A&B, COVID)  RVPGX2  LIPASE, BLOOD    EKG None  Radiology CT ABDOMEN PELVIS W CONTRAST Result Date: 08/16/2023 CLINICAL DATA:  Abdominal pain, acute, nonlocalized EXAM: CT ABDOMEN AND PELVIS WITH CONTRAST TECHNIQUE: Multidetector CT imaging of the abdomen and pelvis was performed using the standard protocol following bolus administration of intravenous contrast. RADIATION DOSE REDUCTION: This exam was performed according to the departmental dose-optimization program which includes automated exposure control, adjustment of the mA and/or kV according to patient size and/or use of iterative reconstruction technique. CONTRAST:  75mL OMNIPAQUE IOHEXOL 350 MG/ML SOLN COMPARISON:  09/01/2020 FINDINGS: Lower chest: Included lung bases are clear.  Heart size is normal. Hepatobiliary: Liver measures 19 cm in length. Diffusely decreased attenuation of the hepatic parenchyma. No focal liver lesion is identified. Unremarkable gallbladder. No hyperdense gallstone. No biliary dilatation. Pancreas: Unremarkable. No pancreatic ductal dilatation or surrounding inflammatory changes. Spleen: Normal in size without focal abnormality. Adrenals/Urinary Tract: Unremarkable adrenal glands. Kidneys enhance symmetrically. Assessment for renal stones is limited as excreted contrast is present within the renal papillae. No hydronephrosis. No renal lesion. Unremarkable ureters. Urinary bladder within normal limits for the degree of distension. Stomach/Bowel: Stomach within normal limits. Mild long segment small bowel wall thickening within the mid abdomen. No abnormally dilated loops of small bowel. Normal appendix in the right  lower quadrant (series 3, image 55). Borderline-mild colonic wall thickening centered at the hepatic flexure, which may be exaggerated by under-distension. No pericolonic inflammatory changes or fluid. Vascular/Lymphatic: No significant vascular findings are present. No enlarged abdominal or pelvic lymph nodes. Reproductive: Prostate is unremarkable. Other: No free air or free fluid. Musculoskeletal: No acute or significant osseous findings. IMPRESSION: 1. Findings favor a mild enterocolitis. 2. Hepatic steatosis. Electronically Signed   By: Duanne Guess D.O.   On: 08/16/2023 16:42    Procedures Procedures    Medications Ordered in ED Medications  promethazine (PHENERGAN) 12.5 mg in sodium chloride 0.9 % 50 mL IVPB (0 mg Intravenous  Stopped 08/16/23 1652)  sodium chloride 0.9 % bolus 1,000 mL (0 mLs Intravenous Stopped 08/16/23 1806)  iohexol (OMNIPAQUE) 350 MG/ML injection 75 mL (75 mLs Intravenous Contrast Given 08/16/23 1618)  sodium chloride 0.9 % bolus 1,000 mL (0 mLs Intravenous Stopped 08/16/23 1946)    ED Course/ Medical Decision Making/ A&P                                 Medical Decision Making Amount and/or Complexity of Data Reviewed Labs: ordered.   Medical Decision Making:   Steve Stanley is a 38 y.o. male who presented to the ED today with nausea, vomiting, diarrhea, abdominal pain.  On my exam he is some mild diffuse tenderness no peritonitis.  He already had a CT scan notable for enterocolitis which is consistent with his symptoms.  He is mild leukocytosis but no fever or other systemic signs of infection.  I do not think there is an indication for antibiotics.  His initial CMP showed slight elevation in creatinine as well as mild transaminitis and slightly elevated potassium.  I repeated this, bicarb is stable, potassium is normalized and kidney function is improving with fluids.  He is tolerating p.o.  His urinalysis shows evidence of dehydration but no signs of  infection.  Discussed that he is a follow close with his PCP.  He also needs to follow-up regarding his liver enzymes which were slightly elevated and hepatic steatosis which was incidentally found on his CT scan.  Patient is comfortable with this plan.  Strict return precautions were given.  Discharged with Zofran.   Patient placed on continuous vitals and telemetry monitoring while in ED which was reviewed periodically.  Reviewed and confirmed nursing documentation for past medical history, family history, social history.  Patient's presentation is most consistent with acute complicated illness / injury requiring diagnostic workup.           Final Clinical Impression(s) / ED Diagnoses Final diagnoses:  Gastroenteritis    Rx / DC Orders ED Discharge Orders          Ordered    ondansetron (ZOFRAN-ODT) 4 MG disintegrating tablet  Every 8 hours PRN        08/16/23 2038              Laurence Spates, MD 08/16/23 2038

## 2023-08-16 NOTE — ED Provider Triage Note (Signed)
 Emergency Medicine Provider Triage Evaluation Note  [place] A Gebhardt , a 38 y.o. male  was evaluated in triage.  Pt complains of nausea, vomiting, diarrhea.  Review of Systems  Positive:  Negative:   Physical Exam  BP 108/83 (BP Location: Right Arm)   Pulse (!) 105   Temp 98.5 F (36.9 C) (Axillary)   Resp 18   Wt 59 kg   SpO2 100%   BMI 23.03 kg/m  Gen:   Awake, no distress   Resp:  Normal effort  MSK:   Moves extremities without difficulty  Other:    Medical Decision Making  Medically screening exam initiated at 3:02 PM.  Appropriate orders placed.  [place] A Derk was informed that the remainder of the evaluation will be completed by another provider, this initial triage assessment does not replace that evaluation, and the importance of remaining in the ED until their evaluation is complete.  Severe nausea, vomiting, diarrhea since 6AM. Family with similar symptoms. Patient went to UC who referred patient to ED since vomiting was not controlled with IM zofran. Patient also with lower abdominal pain.   Dorthy Cooler, [place] 08/16/23 [phone]

## 2023-08-16 NOTE — ED Notes (Signed)
 Patient is being discharged from the Urgent Care and sent to the Emergency Department via POV . Per [facility] PA, patient is in need of higher level of care due to retractable vomiting. Patient is aware and verbalizes understanding of plan of care.  Vitals:   08/16/23 1306  BP: (!) 144/84  Pulse: (!) 112  Resp: 18  Temp: 97.6 F (36.4 C)  SpO2: 97%

## 2023-08-16 NOTE — ED Triage Notes (Signed)
 Pt states started having diarrhea and vomiting this morning. He feels dizzy like he is going to pass out. Wife states he vomiting about 20 mins ago.

## 2023-08-16 NOTE — ED Provider Notes (Signed)
 [facility]    CSN: [id number] Arrival date & time: 08/16/23  1301      History   Chief Complaint Chief Complaint  Patient presents with   Emesis   Diarrhea    HPI Steve Stanley is a 38 y.o. male.   38 year old male who presents urgent care with complaints of nausea, vomiting and diarrhea.  This started at about 6:00 this morning.  It has been severe with running in cycles.  He is vomiting so much that at times he is soiling himself.  He reports that other members of his family have had the same symptoms but got better very quickly.  He denies fevers or chills.  He denies any abdominal pain, dysuria, hematuria, sore throat.   Emesis Associated symptoms: diarrhea   Associated symptoms: no abdominal pain, no arthralgias, no chills, no cough, no fever and no sore throat   Diarrhea Associated symptoms: vomiting   Associated symptoms: no abdominal pain, no arthralgias, no chills and no fever     Past Medical History:  Diagnosis Date   Anxiety    Atrial septal defect    Depression    Heart disease, unspecified    Heartburn    Mitral valve prolapse     There are no active problems to display for this patient.   Past Surgical History:  Procedure Laterality Date   CARDIAC SURGERY     PDA    HERNIA REPAIR  05/01/2012   rih repair   PATENT DUCTUS ARTERIOUS REPAIR         Home Medications    Prior to Admission medications   Medication Sig Start Date End Date Taking? Authorizing Provider  gabapentin (NEURONTIN) 300 MG capsule Take 1 capsule (300 mg total) by mouth at bedtime. 07/31/21   Rodolph Bong, MD  predniSONE (DELTASONE) 50 MG tablet Take 1 pill daily for 5 days 07/31/21   Rodolph Bong, MD    Family History Family History  Problem Relation Age of Onset   Diabetes Mother    Diabetes Father    Hypertension Father    Heart disease Father     Social History Social History   Tobacco Use   Smoking status: Never   Smokeless tobacco: Never   Vaping Use   Vaping status: Never Used  Substance Use Topics   Alcohol use: Yes    Comment: DAILY    Drug use: Yes    Types: Marijuana     Allergies   Benadryl [diphenhydramine]   Review of Systems Review of Systems  Constitutional:  Positive for appetite change. Negative for chills and fever.  HENT:  Negative for ear pain and sore throat.   Eyes:  Negative for pain and visual disturbance.  Respiratory:  Negative for cough and shortness of breath.   Cardiovascular:  Positive for leg swelling. Negative for chest pain and palpitations.  Gastrointestinal:  Positive for diarrhea, nausea and vomiting. Negative for abdominal distention and abdominal pain.  Genitourinary:  Negative for dysuria and hematuria.  Musculoskeletal:  Negative for arthralgias and back pain.  Skin:  Negative for color change and rash.  Neurological:  Negative for seizures and syncope.  All other systems reviewed and are negative.    Physical Exam Triage Vital Signs ED Triage Vitals  Encounter Vitals Group     BP 08/16/23 1306 (!) 144/84     Systolic BP Percentile --      Diastolic BP Percentile --  Pulse Rate 08/16/23 1306 (!) 112     Resp 08/16/23 1306 18     Temp 08/16/23 1306 97.6 F (36.4 C)     Temp Source 08/16/23 1306 Temporal     SpO2 08/16/23 1306 97 %     Weight --      Height --      Head Circumference --      Peak Flow --      Pain Score 08/16/23 1304 9     Pain Loc --      Pain Education --      Exclude from Growth Chart --    No data found.  Updated Vital Signs BP (!) 144/84 (BP Location: Right Arm)   Pulse (!) 112   Temp 97.6 F (36.4 C) (Temporal)   Resp 18   SpO2 97%   Visual Acuity Right Eye Distance:   Left Eye Distance:   Bilateral Distance:    Right Eye Near:   Left Eye Near:    Bilateral Near:     Physical Exam Vitals and nursing note reviewed.  Constitutional:      General: He is not in acute distress.    Appearance: He is well-developed. He is  ill-appearing.  HENT:     Head: Normocephalic and atraumatic.  Eyes:     Conjunctiva/sclera: Conjunctivae normal.  Cardiovascular:     Rate and Rhythm: Normal rate and regular rhythm.     Heart sounds: No murmur heard. Pulmonary:     Effort: Pulmonary effort is normal. No respiratory distress.     Breath sounds: Normal breath sounds.  Abdominal:     Palpations: Abdomen is soft.     Tenderness: There is no abdominal tenderness.  Musculoskeletal:        General: No swelling.     Cervical back: Neck supple.  Skin:    General: Skin is warm and dry.     Capillary Refill: Capillary refill takes less than 2 seconds.  Neurological:     Mental Status: He is alert.  Psychiatric:        Mood and Affect: Mood normal.      UC Treatments / Results  Labs (all labs ordered are listed, but only abnormal results are displayed) Labs Reviewed  POC COVID19/FLU A&B COMBO    EKG   Radiology No results found.  Procedures Procedures (including critical care time)  Medications Ordered in UC Medications  ondansetron (ZOFRAN-ODT) disintegrating tablet 4 mg (4 mg Oral Not Given 08/16/23 1309)  ondansetron (ZOFRAN) injection 4 mg (4 mg Intramuscular Given 08/16/23 1312)    Initial Impression / Assessment and Plan / UC Course  I have reviewed the triage vital signs and the nursing notes.  Pertinent labs & imaging results that were available during my care of the patient were reviewed by me and considered in my medical decision making (see chart for details).     Intractable nausea and vomiting  Nausea vomiting and diarrhea   Flu A, flu B and COVID are negative.  Unfortunately, there is intractable nausea and vomiting with diarrhea including difficulty holding the diarrhea when vomiting causing the patient to soil himself.  This has continued despite IM Zofran.  At this point recommend further evaluation at the emergency room.   Final Clinical Impressions(s) / UC Diagnoses   Final  diagnoses:  Nausea vomiting and diarrhea  Intractable nausea and vomiting     Discharge Instructions      Flu A, flu B  and COVID are negative.  Unfortunately, there is intractable nausea and vomiting with diarrhea.  This has continued despite IM Zofran.  At this point recommend further evaluation at the emergency room.   ED Prescriptions   None    PDMP not reviewed this encounter.   Landis Martins, [place] 08/16/23 1428

## 2023-08-16 NOTE — ED Notes (Signed)
 Pt given zofran injection and dc oral zofran due to active vomiting.

## 2023-08-16 NOTE — ED Triage Notes (Signed)
 Pt presents form home for N/V/D since 6am today, lower abd pain.  Family with similar illness.

## 2023-08-17 ENCOUNTER — Ambulatory Visit: Payer: Self-pay
